# Patient Record
Sex: Male | Born: 1950
Health system: Southern US, Community
[De-identification: ages and names within clinical notes are randomized; demographics above are authoritative.]

## PROBLEM LIST (undated history)

## (undated) DIAGNOSIS — M1990 Unspecified osteoarthritis, unspecified site: Secondary | ICD-10-CM

## (undated) DIAGNOSIS — F32A Depression, unspecified: Secondary | ICD-10-CM

## (undated) DIAGNOSIS — C4491 Basal cell carcinoma of skin, unspecified: Secondary | ICD-10-CM

## (undated) DIAGNOSIS — E119 Type 2 diabetes mellitus without complications: Secondary | ICD-10-CM

## (undated) DIAGNOSIS — F319 Bipolar disorder, unspecified: Secondary | ICD-10-CM

## (undated) DIAGNOSIS — D649 Anemia, unspecified: Secondary | ICD-10-CM

## (undated) DIAGNOSIS — L570 Actinic keratosis: Secondary | ICD-10-CM

## (undated) DIAGNOSIS — I10 Essential (primary) hypertension: Secondary | ICD-10-CM

## (undated) DIAGNOSIS — F329 Major depressive disorder, single episode, unspecified: Secondary | ICD-10-CM

## (undated) DIAGNOSIS — G4730 Sleep apnea, unspecified: Secondary | ICD-10-CM

## (undated) DIAGNOSIS — F419 Anxiety disorder, unspecified: Secondary | ICD-10-CM

## (undated) DIAGNOSIS — N189 Chronic kidney disease, unspecified: Secondary | ICD-10-CM

## (undated) DIAGNOSIS — N289 Disorder of kidney and ureter, unspecified: Secondary | ICD-10-CM

## (undated) DIAGNOSIS — K219 Gastro-esophageal reflux disease without esophagitis: Secondary | ICD-10-CM

## (undated) DIAGNOSIS — M109 Gout, unspecified: Secondary | ICD-10-CM

## (undated) HISTORY — DX: Essential (primary) hypertension: I10

## (undated) HISTORY — PX: INGUINAL HERNIA REPAIR: SUR1180

## (undated) HISTORY — DX: Type 2 diabetes mellitus without complications: E11.9

## (undated) HISTORY — DX: Chronic kidney disease, unspecified: N18.9

## (undated) HISTORY — DX: Bipolar disorder, unspecified: F31.9

## (undated) HISTORY — DX: Disorder of kidney and ureter, unspecified: N28.9

## (undated) HISTORY — PX: OTHER SURGICAL HISTORY: SHX169

## (undated) HISTORY — DX: Basal cell carcinoma of skin, unspecified: C44.91

## (undated) HISTORY — DX: Actinic keratosis: L57.0

## (undated) HISTORY — PX: TRANSURETHRAL RESECTION OF PROSTATE: SHX73

---
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
## 2004-04-13 HISTORY — PX: BACK SURGERY: SHX140

## 2006-04-13 HISTORY — PX: GASTRIC BYPASS: SHX52

## 2015-01-08 DIAGNOSIS — M25511 Pain in right shoulder: Secondary | ICD-10-CM

## 2015-01-08 DIAGNOSIS — G8929 Other chronic pain: Secondary | ICD-10-CM | POA: Insufficient documentation

## 2015-04-14 HISTORY — PX: TOTAL SHOULDER REPLACEMENT: SUR1217

## 2015-04-14 HISTORY — PX: REVISION OF TOTAL SHOULDER: SUR1277

## 2015-05-20 DIAGNOSIS — M19011 Primary osteoarthritis, right shoulder: Secondary | ICD-10-CM | POA: Insufficient documentation

## 2015-06-19 DIAGNOSIS — Z96611 Presence of right artificial shoulder joint: Secondary | ICD-10-CM | POA: Insufficient documentation

## 2016-01-08 DIAGNOSIS — Z96619 Presence of unspecified artificial shoulder joint: Secondary | ICD-10-CM

## 2016-01-08 DIAGNOSIS — T8484XA Pain due to internal orthopedic prosthetic devices, implants and grafts, initial encounter: Secondary | ICD-10-CM | POA: Insufficient documentation

## 2016-02-11 DIAGNOSIS — Z9889 Other specified postprocedural states: Secondary | ICD-10-CM | POA: Insufficient documentation

## 2016-05-01 ENCOUNTER — Encounter (INDEPENDENT_AMBULATORY_CARE_PROVIDER_SITE_OTHER): Payer: Self-pay | Admitting: Neurology

## 2016-05-01 ENCOUNTER — Ambulatory Visit (INDEPENDENT_AMBULATORY_CARE_PROVIDER_SITE_OTHER): Admitting: Neurology

## 2016-05-01 DIAGNOSIS — G5603 Carpal tunnel syndrome, bilateral upper limbs: Secondary | ICD-10-CM

## 2016-05-01 DIAGNOSIS — Z0289 Encounter for other administrative examinations: Secondary | ICD-10-CM

## 2016-05-01 NOTE — Procedures (Signed)
   NCS (NERVE CONDUCTION STUDY) WITH EMG (ELECTROMYOGRAPHY) REPORT   STUDY DATE: @DATE @ PATIENT NAME: Bryan Manning DOB: 19-Dec-1950 MRN: [id number]    TECHNOLOGIST: Laretta Alstrom ELECTROMYOGRAPHER: Marcial Pacas M.D.  CLINICAL INFORMATION:  66 years old right-handed male, presented with a year history of worsening left hand paresthesia, involving first 4 fingers, he denies significant weakness, similar involvement of right hand. He denies neck pain.  On examination: There was no significant bilateral abductor pollicis brevis weakness, decreased pinprick at first the 4 fingerpads. Bilateral wrists Tinel signs.  FINDINGS: NERVE CONDUCTION STUDY: Bilateral ulnar sensory and motor responses were normal. Left median sensory response was absent. Right median sensory response showed mildly prolonged peak latency, 4.9 milliseconds, with well preserved snap amplitude.  Bilateral median motor responses showed significantly prolonged distal latency, left worse than right, left distal latency was 8.9 millisecond, right side was 5.0 millisecond, with well-preserved bilateral C map amplitude, conduction velocity. Left median F wave latency was prolonged at 38 ms, right side was at upper limit of normal 32 ms.      NEEDLE ELECTROMYOGRAPHY: Selected needle examination was performed at left upper extremity muscles, left cervical paraspinal muscles; and right abductor pollicis brevis.   Bilateral abductor pollicis brevis: Normal insertion activity, No spontaneous activity, normal morphology motor unit potential, with mildly decreased recruitment patterns.  Needle examination of left pronator teres, biceps, triceps, deltoid was normal.  There was no spontaneous activity of left cervical paraspinal muscles, left C5, 6, 7.   IMPRESSION:  This is an abnormal study. There is electrodiagnostic evidence of bilateral median neuropathy across the wrist, consistent with bilateral carpal tunnel syndromes. Moderate  at both side, left worse than right, there is no evidence of axonal loss. There is no evidence of left cervical radiculopathy.    INTERPRETING PHYSICIAN:   Marcial Pacas M.D. Ph.D. [facility] [address] [place], [place] [address] [phone]

## 2017-06-29 DIAGNOSIS — G5601 Carpal tunnel syndrome, right upper limb: Secondary | ICD-10-CM | POA: Insufficient documentation

## 2017-09-27 ENCOUNTER — Encounter: Payer: Self-pay | Admitting: Gastroenterology

## 2017-12-22 ENCOUNTER — Encounter: Payer: Self-pay | Admitting: *Deleted

## 2017-12-22 ENCOUNTER — Encounter: Payer: Self-pay | Admitting: Gastroenterology

## 2017-12-22 ENCOUNTER — Other Ambulatory Visit: Payer: Self-pay | Admitting: *Deleted

## 2017-12-22 ENCOUNTER — Ambulatory Visit (INDEPENDENT_AMBULATORY_CARE_PROVIDER_SITE_OTHER): Payer: No Typology Code available for payment source | Admitting: Gastroenterology

## 2017-12-22 VITALS — BP 129/64 | HR 73 | Temp 97.8°F | Ht 75.0 in | Wt 245.2 lb

## 2017-12-22 DIAGNOSIS — Z86010 Personal history of colonic polyps: Secondary | ICD-10-CM | POA: Diagnosis not present

## 2017-12-22 DIAGNOSIS — Z79899 Other long term (current) drug therapy: Secondary | ICD-10-CM

## 2017-12-22 DIAGNOSIS — Z1211 Encounter for screening for malignant neoplasm of colon: Secondary | ICD-10-CM | POA: Insufficient documentation

## 2017-12-22 MED ORDER — PEG 3350-KCL-NA BICARB-NACL 420 G PO SOLR: 4000.0000 mL | Freq: Once | ORAL | 0 refills | Status: AC

## 2017-12-22 NOTE — Progress Notes (Signed)
Primary Care Physician:  [facility]  Primary Gastroenterologist:  Garfield Cornea, MD   Chief Complaint  Patient presents with  . Consult    last TCS was 2014    HPI:  Bryan Manning is a 67 y.o. male here at the request of [facility] for surveillance colonoscopy for history of colon polyps.  Last colonoscopy in 2014, was told to come back in 5 years.  Patient denies any constipation, diarrhea, melena, rectal bleeding.  No abdominal pain.  No unintentional weight loss.  Reflux controlled with Zantac.  No dysphagia.  States his typical GFR runs in the 22-27 range. Will use Trilyte given GFR.    Current Outpatient Medications  Medication Sig Dispense Refill  . B Complex Vitamins (VITAMIN B COMPLEX PO) Take by mouth daily.    Marland Kitchen buPROPion (WELLBUTRIN SR) 150 MG 12 hr tablet Take 150 mg by mouth 2 (two) times daily.    . calcitRIOL (ROCALTROL) 0.5 MCG capsule Take 0.5 mcg by mouth daily.    . Cholecalciferol (VITAMIN D-3) 5000 units TABS Take 10,000 Units by mouth daily.    . COLCHICINE PO Take 1 tablet by mouth as needed.    . desvenlafaxine (PRISTIQ) 100 MG 24 hr tablet Take 100 mg by mouth daily.    . furosemide (LASIX) 40 MG tablet Take 40 mg by mouth 2 (two) times daily.    Marland Kitchen gabapentin (NEURONTIN) 300 MG capsule Take 300 mg by mouth at bedtime.    Marland Kitchen glipiZIDE (GLUCOTROL) 5 MG tablet Take 5 mg by mouth daily before breakfast.    . hydrALAZINE (APRESOLINE) 25 MG tablet Take 25 mg by mouth 2 (two) times daily.    Marland Kitchen labetalol (NORMODYNE) 300 MG tablet Take 300 mg by mouth 2 (two) times daily.    Marland Kitchen loratadine (CLARITIN) 10 MG tablet Take 10 mg by mouth daily.    . QUEtiapine (SEROQUEL) 400 MG tablet Take 400 mg by mouth at bedtime.    . ranitidine (ZANTAC) 150 MG tablet Take 300 mg by mouth 2 (two) times daily.     No current facility-administered medications for this visit.     Allergies as of 12/22/2017 - Review Complete 12/22/2017  Allergen Reaction Noted  .  Iodinated diagnostic agents Hives and Itching 01/08/2015  . Tape Hives 01/08/2015  . Nsaids Other (See Comments) 06/10/2015  . Other  12/02/2016    Past Medical History:  Diagnosis Date  . Bipolar 1 disorder ([place])   . Chronic renal insufficiency    GFR 23  . Diabetes ([place])   . Hypertension     Past Surgical History:  Procedure Laterality Date  . BACK SURGERY  2006  . GASTRIC BYPASS  2008  . INGUINAL HERNIA REPAIR     age 9  . REVISION OF TOTAL SHOULDER  2017  . soft palate reduction    . TOTAL SHOULDER REPLACEMENT  2017  . TRANSURETHRAL RESECTION OF PROSTATE     X 2    Family History  Problem Relation Age of Onset  . Rheum arthritis Mother   . Diabetes Mother   . Heart disease Father   . Colon cancer Neg Hx     Social History   Socioeconomic History  . Marital status: Married    Spouse name: Not on file  . Number of children: Not on file  . Years of education: Not on file  . Highest education level: Not on file  Occupational History  .  Not on file  Social Needs  . Financial resource strain: Not on file  . Food insecurity:    Worry: Not on file    Inability: Not on file  . Transportation needs:    Medical: Not on file    Non-medical: Not on file  Tobacco Use  . Smoking status: Former Smoker    Types: Cigarettes  . Smokeless tobacco: Never Used  Substance and Sexual Activity  . Alcohol use: Yes    Comment: occas  . Drug use: Never  . Sexual activity: Not on file  Lifestyle  . Physical activity:    Days per week: Not on file    Minutes per session: Not on file  . Stress: Not on file  Relationships  . Social connections:    Talks on phone: Not on file    Gets together: Not on file    Attends religious service: Not on file    Active member of club or organization: Not on file    Attends meetings of clubs or organizations: Not on file    Relationship status: Not on file  . Intimate partner violence:    Fear of current or ex partner: Not on file     Emotionally abused: Not on file    Physically abused: Not on file    Forced sexual activity: Not on file  Other Topics Concern  . Not on file  Social History Narrative  . Not on file      ROS:  General: Negative for anorexia, weight loss, fever, chills, fatigue, weakness. Eyes: Negative for vision changes.  ENT: Negative for hoarseness, difficulty swallowing , nasal congestion. CV: Negative for chest pain, angina, palpitations, dyspnea on exertion, peripheral edema.  Respiratory: Negative for dyspnea at rest, dyspnea on exertion, cough, sputum, wheezing.  GI: See history of present illness. GU:  Negative for dysuria, hematuria, urinary incontinence, urinary frequency, nocturnal urination.  MS: Negative for joint pain, low back pain.  Derm: Negative for rash or itching.  Neuro: Negative for weakness, abnormal sensation, seizure, frequent headaches, memory loss, confusion.  Psych: Negative for anxiety, depression, suicidal ideation, hallucinations.  Endo: Negative for unusual weight change.  Heme: Negative for bruising or bleeding. Allergy: Negative for rash or hives.    Physical Examination:  BP 129/64   Pulse 73   Temp 97.8 F (36.6 C) (Oral)   Ht 6\' 3"  (1.905 m)   Wt 245 lb 3.2 oz (111.2 kg)   BMI 30.65 kg/m    General: Well-nourished, well-developed in no acute distress.  Head: Normocephalic, atraumatic.   Eyes: Conjunctiva pink, no icterus. Mouth: Oropharyngeal mucosa moist and pink , no lesions erythema or exudate. Neck: Supple without thyromegaly, masses, or lymphadenopathy.  Lungs: Clear to auscultation bilaterally.  Heart: Regular rate and rhythm, no murmurs rubs or gallops.  Abdomen: Bowel sounds are normal, nontender, nondistended, no hepatosplenomegaly or masses, no abdominal bruits or    hernia , no rebound or guarding.   Rectal: deferred Extremities: No lower extremity edema. No clubbing or deformities.  Neuro: Alert and oriented x 4 , grossly normal  neurologically.  Skin: Warm and dry, no rash or jaundice.   Psych: Alert and cooperative, normal mood and affect.  Labs: Labs dated September 20, 2017 White blood cell count 6700, hemoglobin 15.2, platelets 219,000, glucose 184, BUN 30, creatinine 2.76, potassium 4, sodium 140, TSH 1.020, total bilirubin 0.7, alkaline phosphatase 97, AST 13, ALT 23, albumin 4, GFR 23  Imaging Studies: No results  found.

## 2017-12-22 NOTE — Patient Instructions (Signed)
1. Colonoscopy as scheduled. See separate instructions.  

## 2017-12-23 ENCOUNTER — Telehealth: Payer: Self-pay | Admitting: *Deleted

## 2017-12-23 NOTE — Telephone Encounter (Signed)
Pre-op scheduled for 01/12/18 at 1:15pm. Patient letter. Letter mailed.

## 2017-12-24 ENCOUNTER — Encounter: Payer: Self-pay | Admitting: Gastroenterology

## 2017-12-24 DIAGNOSIS — Z86010 Personal history of colonic polyps: Secondary | ICD-10-CM | POA: Insufficient documentation

## 2017-12-24 NOTE — Assessment & Plan Note (Signed)
67 year old gentleman presenting for 5-year surveillance colonoscopy for history of colon polyps.  Denies any GI symptoms.  Plan for deep sedation given polypharmacy.  I have discussed the risks, alternatives, benefits with regards to but not limited to the risk of reaction to medication, bleeding, infection, perforation and the patient is agreeable to proceed. Written consent to be obtained.

## 2017-12-27 NOTE — Progress Notes (Signed)
CC'D TO PCP °

## 2018-01-11 NOTE — Patient Instructions (Signed)
Bryan Manning  01/11/2018     @PREFPERIOPPHARMACY @   Your procedure is scheduled on 01/17/2018.  Report to [facility] at 9:15 A.M.  Call this number if you have problems the morning of surgery:  [phone]   Remember:  Do not eat or drink after midnight.  FOLLOW INSTRUCTIONS GIVEN TO YOU BY DR ROURK'S OFFICE.  PLEASE CALL HIS OFFICE WITH ANY QUESTIONS             REGARDING PREP      Take these medicines the morning of surgery with A SIP OF WATER Wellbutrin, Pristiq, Gabapentin, Labetalol, Claritin, Seroquel, Zantac    Do not wear jewelry, make-up or nail polish.  Do not wear lotions, powders, or perfumes, or deodorant.  Do not shave 48 hours prior to surgery.  Men may shave face and neck.  Do not bring valuables to the hospital.  [facility] is not responsible for any belongings or valuables.  Contacts, dentures or bridgework may not be worn into surgery.  Leave your suitcase in the car.  After surgery it may be brought to your room.  For patients admitted to the hospital, discharge time will be determined by your treatment team.  Patients discharged the day of surgery will not be allowed to drive home.    Please read over the following fact sheets that you were given. Anesthesia Post-op Instructions      PATIENT INSTRUCTIONS POST-ANESTHESIA  IMMEDIATELY FOLLOWING SURGERY:  Do not drive or operate machinery for the first twenty four hours after surgery.  Do not make any important decisions for twenty four hours after surgery or while taking narcotic pain medications or sedatives.  If you develop intractable nausea and vomiting or a severe headache please notify your doctor immediately.  FOLLOW-UP:  Please make an appointment with your surgeon as instructed. You do not need to follow up with anesthesia unless specifically instructed to do so.  WOUND CARE INSTRUCTIONS (if applicable):  Keep a dry clean dressing on the anesthesia/puncture wound site if there is drainage.   Once the wound has quit draining you may leave it open to air.  Generally you should leave the bandage intact for twenty four hours unless there is drainage.  If the epidural site drains for more than 36-48 hours please call the anesthesia department.  QUESTIONS?:  Please feel free to call your physician or the hospital operator if you have any questions, and they will be happy to assist you.      Colonoscopy, Adult A colonoscopy is an exam to look at the entire large intestine. During the exam, a lubricated, bendable tube is inserted into the anus and then passed into the rectum, colon, and other parts of the large intestine. A colonoscopy is often done as a part of normal colorectal screening or in response to certain symptoms, such as anemia, persistent diarrhea, abdominal pain, and blood in the stool. The exam can help screen for and diagnose medical problems, including:  Tumors.  Polyps.  Inflammation.  Areas of bleeding.  Tell a health care provider about:  Any allergies you have.  All medicines you are taking, including vitamins, herbs, eye drops, creams, and over-the-counter medicines.  Any problems you or family members have had with anesthetic medicines.  Any blood disorders you have.  Any surgeries you have had.  Any medical conditions you have.  Any problems you have had passing stool. What are the risks? Generally, this is a safe procedure. However, problems may  occur, including:  Bleeding.  A tear in the intestine.  A reaction to medicines given during the exam.  Infection (rare).  What happens before the procedure? Eating and drinking restrictions Follow instructions from your health care provider about eating and drinking, which may include:  A few days before the procedure - follow a low-fiber diet. Avoid nuts, seeds, dried fruit, raw fruits, and vegetables.  1-3 days before the procedure - follow a clear liquid diet. Drink only clear liquids, such as  clear broth or bouillon, black coffee or tea, clear juice, clear soft drinks or sports drinks, gelatin dessert, and popsicles. Avoid any liquids that contain red or purple dye.  On the day of the procedure - do not eat or drink anything during the 2 hours before the procedure, or within the time period that your health care provider recommends.  Bowel prep If you were prescribed an oral bowel prep to clean out your colon:  Take it as told by your health care provider. Starting the day before your procedure, you will need to drink a large amount of medicated liquid. The liquid will cause you to have multiple loose stools until your stool is almost clear or light green.  If your skin or anus gets irritated from diarrhea, you may use these to relieve the irritation: ? Medicated wipes, such as adult wet wipes with aloe and vitamin E. ? A skin soothing-product like petroleum jelly.  If you vomit while drinking the bowel prep, take a break for up to 60 minutes and then begin the bowel prep again. If vomiting continues and you cannot take the bowel prep without vomiting, call your health care provider.  General instructions  Ask your health care provider about changing or stopping your regular medicines. This is especially important if you are taking diabetes medicines or blood thinners.  Plan to have someone take you home from the hospital or clinic. What happens during the procedure?  An IV tube may be inserted into one of your veins.  You will be given medicine to help you relax (sedative).  To reduce your risk of infection: ? Your health care team will wash or sanitize their hands. ? Your anal area will be washed with soap.  You will be asked to lie on your side with your knees bent.  Your health care provider will lubricate a long, thin, flexible tube. The tube will have a camera and a light on the end.  The tube will be inserted into your anus.  The tube will be gently eased  through your rectum and colon.  Air will be delivered into your colon to keep it open. You may feel some pressure or cramping.  The camera will be used to take images during the procedure.  A small tissue sample may be removed from your body to be examined under a microscope (biopsy). If any potential problems are found, the tissue will be sent to a lab for testing.  If small polyps are found, your health care provider may remove them and have them checked for cancer cells.  The tube that was inserted into your anus will be slowly removed. The procedure may vary among health care providers and hospitals. What happens after the procedure?  Your blood pressure, heart rate, breathing rate, and blood oxygen level will be monitored until the medicines you were given have worn off.  Do not drive for 24 hours after the exam.  You may have a small amount of  blood in your stool.  You may pass gas and have mild abdominal cramping or bloating due to the air that was used to inflate your colon during the exam.  It is up to you to get the results of your procedure. Ask your health care provider, or the department performing the procedure, when your results will be ready. This information is not intended to replace advice given to you by your health care provider. Make sure you discuss any questions you have with your health care provider. Document Released: 03/27/2000 Document Revised: 01/29/2016 Document Reviewed: 06/11/2015 Elsevier Interactive Patient Education  2018 [employer].

## 2018-01-12 ENCOUNTER — Encounter (HOSPITAL_COMMUNITY): Payer: Self-pay

## 2018-01-12 ENCOUNTER — Encounter (HOSPITAL_COMMUNITY)
Admission: RE | Admit: 2018-01-12 | Discharge: 2018-01-12 | Disposition: A | Discharge status: Home / Self Care | Payer: Medicare Other | Source: Ambulatory Visit | Attending: Internal Medicine | Admitting: Internal Medicine

## 2018-01-12 ENCOUNTER — Other Ambulatory Visit: Payer: Self-pay

## 2018-01-12 DIAGNOSIS — Z1211 Encounter for screening for malignant neoplasm of colon: Secondary | ICD-10-CM | POA: Diagnosis not present

## 2018-01-12 DIAGNOSIS — I447 Left bundle-branch block, unspecified: Secondary | ICD-10-CM | POA: Diagnosis not present

## 2018-01-12 DIAGNOSIS — Z01818 Encounter for other preprocedural examination: Secondary | ICD-10-CM | POA: Diagnosis not present

## 2018-01-12 HISTORY — DX: Sleep apnea, unspecified: G47.30

## 2018-01-12 HISTORY — DX: Anemia, unspecified: D64.9

## 2018-01-12 HISTORY — DX: Gout, unspecified: M10.9

## 2018-01-12 LAB — BASIC METABOLIC PANEL
Anion gap: 10 (ref 5–15)
BUN: 36 mg/dL — ABNORMAL HIGH (ref 8–23)
CO2: 28 mmol/L (ref 22–32)
CREATININE: 2.51 mg/dL — AB (ref 0.61–1.24)
Calcium: 8.6 mg/dL — ABNORMAL LOW (ref 8.9–10.3)
Chloride: 104 mmol/L (ref 98–111)
GFR calc Af Amer: 30 mL/min — ABNORMAL LOW (ref >60)
GFR, EST NON AFRICAN AMERICAN: 25 mL/min — AB (ref >60)
GLUCOSE: 116 mg/dL — AB (ref 70–99)
POTASSIUM: 3.6 mmol/L (ref 3.5–5.1)
Sodium: 142 mmol/L (ref 135–145)

## 2018-01-12 LAB — CBC
HEMATOCRIT: 40.8 % (ref 39.0–52.0)
Hemoglobin: 13.9 g/dL (ref 13.0–17.0)
MCH: 32.7 pg (ref 26.0–34.0)
MCHC: 34.1 g/dL (ref 30.0–36.0)
MCV: 96 fL (ref 78.0–100.0)
Platelets: 253 10*3/uL (ref 150–400)
RBC: 4.25 MIL/uL (ref 4.22–5.81)
RDW: 12.8 % (ref 11.5–15.5)
WBC: 7.4 10*3/uL (ref 4.0–10.5)

## 2018-01-17 ENCOUNTER — Encounter (HOSPITAL_COMMUNITY): Admission: RE | Payer: Self-pay | Source: Ambulatory Visit

## 2018-01-17 ENCOUNTER — Ambulatory Visit (HOSPITAL_COMMUNITY): Admission: RE | Admit: 2018-01-17 | Payer: Non-veteran care | Source: Ambulatory Visit | Admitting: Internal Medicine

## 2018-01-17 SURGERY — COLONOSCOPY WITH PROPOFOL
Anesthesia: Monitor Anesthesia Care

## 2018-01-18 ENCOUNTER — Ambulatory Visit: Payer: Self-pay

## 2018-01-18 ENCOUNTER — Ambulatory Visit (INDEPENDENT_AMBULATORY_CARE_PROVIDER_SITE_OTHER): Payer: Non-veteran care | Admitting: Sports Medicine

## 2018-01-18 ENCOUNTER — Ambulatory Visit (INDEPENDENT_AMBULATORY_CARE_PROVIDER_SITE_OTHER)

## 2018-01-18 ENCOUNTER — Encounter: Payer: Self-pay | Admitting: Sports Medicine

## 2018-01-18 ENCOUNTER — Telehealth: Payer: Self-pay | Admitting: *Deleted

## 2018-01-18 VITALS — BP 131/74 | HR 63

## 2018-01-18 DIAGNOSIS — M19079 Primary osteoarthritis, unspecified ankle and foot: Secondary | ICD-10-CM

## 2018-01-18 DIAGNOSIS — M25572 Pain in left ankle and joints of left foot: Secondary | ICD-10-CM | POA: Diagnosis not present

## 2018-01-18 DIAGNOSIS — M7752 Other enthesopathy of left foot: Secondary | ICD-10-CM | POA: Diagnosis not present

## 2018-01-18 MED ORDER — TRIAMCINOLONE ACETONIDE 10 MG/ML IJ SUSP: 10.0000 mg | Freq: Once | INTRAMUSCULAR | Status: DC

## 2018-01-18 NOTE — Telephone Encounter (Signed)
-----   Message from Landis Martins, [place] sent at 01/18/2018 11:09 AM EDT ----- Regarding: MRI Report from [facility] Please have them send [place] latest MRI L foot and ankle Thanks Dr. Cannon Kettle

## 2018-01-18 NOTE — Progress Notes (Signed)
Subjective:  Bryan Manning. is a 67 y.o. male patient who presents to office for evaluation of left foot and ankle pain. Patient complains of continued pain in the left foot and ankle since he twisted his foot and injured it in 1992 while in the [employer].  Patient reports that since then he has had constant pain that radiates down from the ankle to the first toe with use especially worse when walking states that the pain is very sharp shooting in nature and sometimes even at rest a dull achy pain states in the past in addition to a sprain injury of which he was always told he had he was placed in a cast back in 1995 states that now the pain has become constant and that he has had all of his treatment within the [place] and has been recently referred out because they do not do any type of arthroscopic procedures of the ankle at the [place] and recommended him to see a civilian doctor. 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  Patient denies any other pedal complaints at this time.  Review of Systems  Musculoskeletal: Positive for joint pain and myalgias.  All other systems reviewed and are negative.    Patient Active Problem List   Diagnosis Date Noted  . H/O adenomatous polyp of colon 12/24/2017  . Encounter for screening colonoscopy 12/22/2017  . Polypharmacy 12/22/2017  . Carpal tunnel syndrome on right 06/29/2017  . S/P shoulder surgery 02/11/2016  . Pain due to shoulder joint prosthesis ([place]) 01/08/2016  . S/P reverse total shoulder arthroplasty, right 06/19/2015  . Arthritis of right shoulder region 05/20/2015  . Chronic right shoulder pain 01/08/2015    Current Outpatient Medications on File Prior to Visit  Medication Sig Dispense Refill  . B Complex Vitamins (VITAMIN B  COMPLEX PO) Take 1 tablet by mouth daily.     Marland Kitchen buPROPion (WELLBUTRIN SR) 150 MG 12 hr tablet Take 150 mg by mouth 2 (two) times daily.    . calcitRIOL (ROCALTROL) 0.5 MCG capsule Take 0.5 mcg by mouth daily.    Marland Kitchen desvenlafaxine (PRISTIQ) 100 MG 24 hr tablet Take 100 mg by mouth at bedtime.     . furosemide (LASIX) 40 MG tablet Take 40 mg by mouth 2 (two) times daily.    Marland Kitchen gabapentin (NEURONTIN) 300 MG capsule Take 300 mg by mouth at bedtime.    Marland Kitchen glipiZIDE (GLUCOTROL) 5 MG tablet Take 5 mg by mouth daily before breakfast.    . hydrALAZINE (APRESOLINE) 25 MG tablet Take 25 mg by mouth 2 (two) times daily.    Marland Kitchen labetalol (NORMODYNE) 300 MG tablet Take 300 mg by mouth 2 (two) times daily.    Marland Kitchen loratadine (CLARITIN) 10 MG tablet Take 10 mg by mouth daily.    . polyvinyl alcohol (ARTIFICIAL TEARS) 1.4 % ophthalmic solution Place 1 drop into both eyes daily as needed for dry eyes.    Marland Kitchen QUEtiapine (SEROQUEL) 400 MG tablet Take 400 mg by mouth at bedtime.    . ranitidine (ZANTAC) 150 MG tablet Take 300 mg by mouth 2 (two) times daily.     No current facility-administered medications on file prior to visit.     Allergies  Allergen Reactions  .  Iodinated Diagnostic Agents Hives and Itching  . Tape Hives  . Nsaids Other (See Comments)    Hx gastric bypass can not take nsaids   . Other Other (See Comments)    TERAMYCIN-trouble breathing    Objective:  General: Alert and oriented x3 in no acute distress  Dermatology: No open lesions bilateral lower extremities, no webspace macerations, no ecchymosis bilateral, all nails x 10 are short and thick but well manicured.  Vascular: Dorsalis Pedis and Posterior Tibial pedal pulses palpable, Capillary Fill Time 3 seconds,(+) pedal hair growth bilateral, no edema bilateral lower extremities,+ mild varicosities bilateral, temperature gradient within normal limits.  Neurology: Johney Maine sensation intact via light touch bilateral.   Musculoskeletal: Mild  tenderness with palpation at posterior tibial tendon course especially at the medial ankle on the left that radiates in the front and side of the ankle. Negative talar tilt, Negative tib-fib stress, No instability. No pain with calf compression bilateral. Range of motion within normal limits with mild guarding on left ankle. Strength within normal limits in all groups bilateral.   Gait: Antalgic gait  Xrays  Left foot and ankle   Impression: Normal osseous mineralization, there is mild ankle joint space narrowing suggestive of early arthritis, mild soft tissue swelling at the ankle otherwise there are no other acute findings.  Assessment and Plan: Problem List Items Addressed This Visit    None    Visit Diagnoses    Tendinitis of left ankle    -  Primary   Relevant Medications   triamcinolone acetonide (KENALOG) 10 MG/ML injection 10 mg (Start on 01/18/2018  8:45 PM)   Other Relevant Orders   DG Ankle Complete Left (Completed)   Pain in left ankle and joints of left foot       Arthritis of ankle          -Complete examination performed -Xrays reviewed -Discussed treatement options for likely tendinitis secondary to arthritis -After oral consent and aseptic prep, injected a mixture containing 1 ml of 2%  plain lidocaine, 1 ml 0.5% plain marcaine, 0.5 ml of kenalog 10 and 0.5 ml of dexamethasone phosphate into posterior tibial tendon course on left without complication. Post-injection care discussed with patient.  -Advised patient to use ankle support that he has at home and have my nurse to instruct him on how to properly wear the Tri-Lock brace of which he states he already has at home -Patient did ask me for additional pain medication however the only medication I advised patient that I could offer him at this time his tramadol and that I could not give him anything more for any ongoing chronic pain -Office to request MRI records from the [place] at this time there is nothing available for  me to review -Patient to return to office in 3 to 4 weeks for follow-up evaluation or sooner if condition worsens.  Landis Martins, DPM

## 2018-01-31 ENCOUNTER — Telehealth: Payer: Self-pay | Admitting: *Deleted

## 2018-01-31 NOTE — Telephone Encounter (Signed)
Received call from [place]. Patient did not have TCS done scheduled for 01/17/18. Called patient and LMOVM.

## 2018-02-01 NOTE — Telephone Encounter (Signed)
Noted  

## 2018-02-01 NOTE — Telephone Encounter (Signed)
[facility] MAILED. FYI to [facility]

## 2018-08-17 ENCOUNTER — Other Ambulatory Visit (HOSPITAL_COMMUNITY): Payer: Self-pay | Admitting: Internal Medicine

## 2018-08-17 ENCOUNTER — Other Ambulatory Visit: Payer: Self-pay | Admitting: Internal Medicine

## 2018-08-17 DIAGNOSIS — R634 Abnormal weight loss: Secondary | ICD-10-CM

## 2018-08-17 DIAGNOSIS — R110 Nausea: Secondary | ICD-10-CM

## 2018-08-17 DIAGNOSIS — R10816 Epigastric abdominal tenderness: Secondary | ICD-10-CM

## 2018-08-25 ENCOUNTER — Other Ambulatory Visit: Payer: Self-pay

## 2018-08-25 ENCOUNTER — Ambulatory Visit
Admission: RE | Admit: 2018-08-25 | Discharge: 2018-08-25 | Disposition: A | Discharge status: Home / Self Care | Payer: Medicare Other | Source: Ambulatory Visit | Attending: Internal Medicine | Admitting: Internal Medicine

## 2018-08-25 DIAGNOSIS — R634 Abnormal weight loss: Secondary | ICD-10-CM | POA: Diagnosis not present

## 2018-08-25 DIAGNOSIS — R10816 Epigastric abdominal tenderness: Secondary | ICD-10-CM | POA: Diagnosis present

## 2018-08-25 DIAGNOSIS — R110 Nausea: Secondary | ICD-10-CM | POA: Insufficient documentation

## 2018-09-01 ENCOUNTER — Other Ambulatory Visit: Admission: RE | Admit: 2018-09-01 | Payer: No Typology Code available for payment source | Source: Ambulatory Visit

## 2018-09-02 ENCOUNTER — Other Ambulatory Visit: Payer: Self-pay

## 2018-09-02 ENCOUNTER — Other Ambulatory Visit
Admission: RE | Admit: 2018-09-02 | Discharge: 2018-09-02 | Disposition: A | Discharge status: Home / Self Care | Payer: Medicare Other | Source: Ambulatory Visit | Attending: Internal Medicine | Admitting: Internal Medicine

## 2018-09-02 ENCOUNTER — Other Ambulatory Visit: Payer: No Typology Code available for payment source

## 2018-09-02 DIAGNOSIS — Z1159 Encounter for screening for other viral diseases: Secondary | ICD-10-CM | POA: Insufficient documentation

## 2018-09-03 LAB — NOVEL CORONAVIRUS, NAA (HOSP ORDER, SEND-OUT TO REF LAB; TAT 18-24 HRS): SARS-CoV-2, NAA: NOT DETECTED

## 2018-09-06 ENCOUNTER — Encounter: Payer: Self-pay | Admitting: *Deleted

## 2018-09-07 ENCOUNTER — Ambulatory Visit: Payer: Medicare Other | Admitting: Certified Registered"

## 2018-09-07 ENCOUNTER — Encounter: Payer: Self-pay | Admitting: *Deleted

## 2018-09-07 ENCOUNTER — Ambulatory Visit
Admission: RE | Admit: 2018-09-07 | Discharge: 2018-09-07 | Disposition: A | Discharge status: Home / Self Care | Payer: Medicare Other | Attending: Internal Medicine | Admitting: Internal Medicine

## 2018-09-07 ENCOUNTER — Other Ambulatory Visit: Payer: Self-pay

## 2018-09-07 ENCOUNTER — Encounter
Admission: RE | Disposition: A | Discharge status: Home / Self Care | Payer: Self-pay | Source: Home / Self Care | Attending: Internal Medicine

## 2018-09-07 DIAGNOSIS — Z79899 Other long term (current) drug therapy: Secondary | ICD-10-CM | POA: Diagnosis not present

## 2018-09-07 DIAGNOSIS — M1990 Unspecified osteoarthritis, unspecified site: Secondary | ICD-10-CM | POA: Diagnosis not present

## 2018-09-07 DIAGNOSIS — R634 Abnormal weight loss: Secondary | ICD-10-CM | POA: Insufficient documentation

## 2018-09-07 DIAGNOSIS — G4730 Sleep apnea, unspecified: Secondary | ICD-10-CM | POA: Diagnosis not present

## 2018-09-07 DIAGNOSIS — N189 Chronic kidney disease, unspecified: Secondary | ICD-10-CM | POA: Diagnosis not present

## 2018-09-07 DIAGNOSIS — D120 Benign neoplasm of cecum: Secondary | ICD-10-CM | POA: Insufficient documentation

## 2018-09-07 DIAGNOSIS — Z7984 Long term (current) use of oral hypoglycemic drugs: Secondary | ICD-10-CM | POA: Insufficient documentation

## 2018-09-07 DIAGNOSIS — E1122 Type 2 diabetes mellitus with diabetic chronic kidney disease: Secondary | ICD-10-CM | POA: Diagnosis not present

## 2018-09-07 DIAGNOSIS — Z9884 Bariatric surgery status: Secondary | ICD-10-CM | POA: Diagnosis not present

## 2018-09-07 DIAGNOSIS — K640 First degree hemorrhoids: Secondary | ICD-10-CM | POA: Insufficient documentation

## 2018-09-07 DIAGNOSIS — D122 Benign neoplasm of ascending colon: Secondary | ICD-10-CM | POA: Diagnosis not present

## 2018-09-07 DIAGNOSIS — R194 Change in bowel habit: Secondary | ICD-10-CM | POA: Diagnosis not present

## 2018-09-07 DIAGNOSIS — Z86010 Personal history of colonic polyps: Secondary | ICD-10-CM | POA: Diagnosis not present

## 2018-09-07 DIAGNOSIS — K219 Gastro-esophageal reflux disease without esophagitis: Secondary | ICD-10-CM | POA: Insufficient documentation

## 2018-09-07 DIAGNOSIS — K2970 Gastritis, unspecified, without bleeding: Secondary | ICD-10-CM | POA: Insufficient documentation

## 2018-09-07 DIAGNOSIS — M109 Gout, unspecified: Secondary | ICD-10-CM | POA: Diagnosis not present

## 2018-09-07 DIAGNOSIS — I129 Hypertensive chronic kidney disease with stage 1 through stage 4 chronic kidney disease, or unspecified chronic kidney disease: Secondary | ICD-10-CM | POA: Diagnosis not present

## 2018-09-07 DIAGNOSIS — F419 Anxiety disorder, unspecified: Secondary | ICD-10-CM | POA: Insufficient documentation

## 2018-09-07 DIAGNOSIS — F319 Bipolar disorder, unspecified: Secondary | ICD-10-CM | POA: Diagnosis not present

## 2018-09-07 DIAGNOSIS — R1013 Epigastric pain: Secondary | ICD-10-CM | POA: Insufficient documentation

## 2018-09-07 DIAGNOSIS — K319 Disease of stomach and duodenum, unspecified: Secondary | ICD-10-CM | POA: Insufficient documentation

## 2018-09-07 HISTORY — DX: Unspecified osteoarthritis, unspecified site: M19.90

## 2018-09-07 HISTORY — DX: Anxiety disorder, unspecified: F41.9

## 2018-09-07 HISTORY — DX: Gastro-esophageal reflux disease without esophagitis: K21.9

## 2018-09-07 HISTORY — DX: Depression, unspecified: F32.A

## 2018-09-07 HISTORY — PX: COLONOSCOPY WITH PROPOFOL: SHX5780

## 2018-09-07 HISTORY — PX: ESOPHAGOGASTRODUODENOSCOPY (EGD) WITH PROPOFOL: SHX5813

## 2018-09-07 LAB — GLUCOSE, CAPILLARY: Glucose-Capillary: 147 mg/dL — ABNORMAL HIGH (ref 70–99)

## 2018-09-07 SURGERY — ESOPHAGOGASTRODUODENOSCOPY (EGD) WITH PROPOFOL
Anesthesia: General

## 2018-09-07 MED ORDER — SODIUM CHLORIDE 0.9 % IV SOLN
INTRAVENOUS | Status: DC
  Administered 2018-09-07: 09:00:00 via INTRAVENOUS

## 2018-09-07 MED ORDER — PROPOFOL 500 MG/50ML IV EMUL
INTRAVENOUS | Status: DC | PRN
  Administered 2018-09-07: 120 ug/kg/min via INTRAVENOUS

## 2018-09-07 MED ORDER — LIDOCAINE 2% (20 MG/ML) 5 ML SYRINGE
INTRAMUSCULAR | Status: DC | PRN
  Administered 2018-09-07: 25 mg via INTRAVENOUS

## 2018-09-07 MED ORDER — GLYCOPYRROLATE 0.2 MG/ML IJ SOLN
INTRAMUSCULAR | Status: DC | PRN
  Administered 2018-09-07: 0.2 mg via INTRAVENOUS

## 2018-09-07 MED ORDER — MIDAZOLAM HCL 5 MG/5ML IJ SOLN
INTRAMUSCULAR | Status: DC | PRN
  Administered 2018-09-07: 2 mg via INTRAVENOUS

## 2018-09-07 MED ORDER — FENTANYL CITRATE (PF) 100 MCG/2ML IJ SOLN
INTRAMUSCULAR | Status: AC
  Filled 2018-09-07: qty 2

## 2018-09-07 MED ORDER — MIDAZOLAM HCL 2 MG/2ML IJ SOLN
INTRAMUSCULAR | Status: AC
  Filled 2018-09-07: qty 2

## 2018-09-07 MED ORDER — FENTANYL CITRATE (PF) 100 MCG/2ML IJ SOLN
INTRAMUSCULAR | Status: DC | PRN
  Administered 2018-09-07: 100 ug via INTRAVENOUS

## 2018-09-07 MED ORDER — PROPOFOL 10 MG/ML IV BOLUS
INTRAVENOUS | Status: DC | PRN
  Administered 2018-09-07: 30 mg via INTRAVENOUS
  Administered 2018-09-07: 70 mg via INTRAVENOUS

## 2018-09-07 NOTE — H&P (Signed)
Outpatient short stay form Pre-procedure 09/07/2018 8:59 AM  K. Alice Reichert, M.D.  Primary Physician: [facility]   Reason for visit:  Epigastric pain, Change in bowel habits, Personal hx of colon polyps.    History of present illness: Patient with epigastric pain, weight loss, no dysphagia. Has chronic constipation which recently has improved with taking daily Miralax. Has personal hx of polyps.     Current Facility-Administered Medications:  .  0.9 %  sodium chloride infusion, , Intravenous, Continuous, , Benay Pike, MD  Medications Prior to Admission  Medication Sig Dispense Refill Last Dose  . ARIPiprazole (ABILIFY) 2 MG tablet Take 2 mg by mouth daily.   09/06/2018 at Unknown time  . calcitRIOL (ROCALTROL) 0.5 MCG capsule Take 0.5 mcg by mouth daily.   Past Week at Unknown time  . famotidine (PEPCID) 20 MG tablet Take 20 mg by mouth 2 (two) times daily.   09/07/2018 at Unknown time  . furosemide (LASIX) 40 MG tablet Take 40 mg by mouth 2 (two) times daily.   09/06/2018 at Unknown time  . gabapentin (NEURONTIN) 300 MG capsule Take 300 mg by mouth at bedtime.   09/06/2018 at Unknown time  . glipiZIDE (GLUCOTROL) 5 MG tablet Take 5 mg by mouth daily before breakfast.   09/06/2018 at Unknown time  . labetalol (NORMODYNE) 300 MG tablet Take 50 mg by mouth 2 (two) times daily.    09/07/2018 at Unknown time  . Lidocaine 5 % CREA Apply 300 g topically 3 (three) times daily as needed.     . loratadine (CLARITIN) 10 MG tablet Take 10 mg by mouth daily.   09/06/2018 at Unknown time  . PARoxetine (PAXIL) 20 MG tablet Take 20 mg by mouth daily.   09/06/2018 at Unknown time  . tamsulosin (FLOMAX) 0.4 MG CAPS capsule Take 0.4 mg by mouth daily.   09/06/2018 at Unknown time  . atorvastatin (LIPITOR) 20 MG tablet Take 20 mg by mouth daily at 6 PM.   Not Taking at Unknown time  . B Complex Vitamins (VITAMIN B COMPLEX PO) Take 1 tablet by mouth daily.    Taking  . buPROPion (WELLBUTRIN SR) 150 MG 12 hr  tablet Take 150 mg by mouth 2 (two) times daily.   Not Taking at Unknown time  . clonazePAM (KLONOPIN) 0.5 MG tablet Take 0.5 mg by mouth 2 (two) times daily as needed for anxiety.   Not Taking at Unknown time  . desvenlafaxine (PRISTIQ) 100 MG 24 hr tablet Take 100 mg by mouth at bedtime.    Not Taking at Unknown time  . ferrous sulfate 325 (65 FE) MG EC tablet Take 325 mg by mouth 2 (two) times daily with a meal.   Not Taking at Unknown time  . hydrALAZINE (APRESOLINE) 25 MG tablet Take 25 mg by mouth 2 (two) times daily.   Taking  . polyvinyl alcohol (ARTIFICIAL TEARS) 1.4 % ophthalmic solution Place 1 drop into both eyes daily as needed for dry eyes.     Marland Kitchen QUEtiapine (SEROQUEL) 400 MG tablet Take 400 mg by mouth at bedtime.   Not Taking at Unknown time  . ranitidine (ZANTAC) 150 MG tablet Take 300 mg by mouth 2 (two) times daily.   Not Taking at Unknown time  . venlafaxine (EFFEXOR) 37.5 MG tablet Take 37.5 mg by mouth at bedtime.   Not Taking at Unknown time     Allergies  Allergen Reactions  . Iodinated Diagnostic Agents Hives and Itching  .  Other Other (See Comments)    TERAMYCIN-trouble breathing  . Tape Hives and Rash  . Nsaids Other (See Comments)    Hx gastric bypass can not take nsaids      Past Medical History:  Diagnosis Date  . Anemia   . Anxiety   . Arthritis   . Bipolar 1 disorder ([place])   . Chronic renal insufficiency    GFR 23  . Depression   . Diabetes ([place])   . GERD (gastroesophageal reflux disease)   . Gout   . Hypertension   . Sleep apnea    cannot tolerate CPAP    Review of systems:  Otherwise negative.    Physical Exam  Gen: Alert, oriented. Appears stated age.  HEENT: [place]/AT. PERRLA. Lungs: CTA, no wheezes. CV: RR nl S1, S2. Abd: soft, benign, no masses. BS+ Ext: No edema. Pulses 2+    Planned procedures: Proceed with EGD and colonoscopy. The patient understands the nature of the planned procedure, indications, risks, alternatives and  potential complications including but not limited to bleeding, infection, perforation, damage to internal organs and possible oversedation/side effects from anesthesia. The patient agrees and gives consent to proceed.  Please refer to procedure notes for findings, recommendations and patient disposition/instructions.      K. Alice Reichert, M.D. Gastroenterology 09/07/2018  8:59 AM

## 2018-09-07 NOTE — Anesthesia Postprocedure Evaluation (Signed)
Anesthesia Post Note  Patient: Bryan Manning.  Procedure(s) Performed: ESOPHAGOGASTRODUODENOSCOPY (EGD) WITH PROPOFOL (N/A ) COLONOSCOPY WITH PROPOFOL (N/A )  Patient location during evaluation: Endoscopy Anesthesia Type: General Level of consciousness: awake and alert and oriented Pain management: pain level controlled Vital Signs Assessment: post-procedure vital signs reviewed and stable Respiratory status: spontaneous breathing, nonlabored ventilation and respiratory function stable Cardiovascular status: blood pressure returned to baseline and stable Postop Assessment: no signs of nausea or vomiting Anesthetic complications: no     Last Vitals:  Vitals:   09/07/18 1035 09/07/18 1052  BP: 139/77 (!) 141/69  Pulse: 95 88  Resp: 15 17  Temp: (!) 36.3 C   SpO2: 98% 100%    Last Pain:  Vitals:   09/07/18 1052  TempSrc:   PainSc: 0-No pain                 [name]

## 2018-09-07 NOTE — Anesthesia Preprocedure Evaluation (Signed)
Anesthesia Evaluation  Patient identified by MRN, date of birth, ID band Patient awake    Reviewed: Allergy & Precautions, NPO status , Patient's Chart, lab work & pertinent test results  History of Anesthesia Complications Negative for: history of anesthetic complications  Airway Mallampati: II  TM Distance: >3 FB Neck ROM: Full    Dental no notable dental hx.    Pulmonary sleep apnea (doesn't use CPAP) , neg COPD, former smoker,    breath sounds clear to auscultation- rhonchi (-) wheezing      Cardiovascular hypertension, Pt. on medications (-) CAD, (-) Past MI, (-) Cardiac Stents and (-) CABG  Rhythm:Regular Rate:Normal - Systolic murmurs and - Diastolic murmurs    Neuro/Psych neg Seizures PSYCHIATRIC DISORDERS Anxiety Depression Bipolar Disorder negative neurological ROS     GI/Hepatic Neg liver ROS, GERD  ,  Endo/Other  diabetes, Oral Hypoglycemic Agents  Renal/GU Renal InsufficiencyRenal disease     Musculoskeletal  (+) Arthritis ,   Abdominal (+) - obese,   Peds  Hematology  (+) anemia ,   Anesthesia Other Findings Past Medical History: No date: Anemia No date: Anxiety No date: Arthritis No date: Bipolar 1 disorder (HCC) No date: Chronic renal insufficiency     Comment:  GFR 23 No date: Depression No date: Diabetes (HCC) No date: GERD (gastroesophageal reflux disease) No date: Gout No date: Hypertension No date: Sleep apnea     Comment:  cannot tolerate CPAP   Reproductive/Obstetrics                             Anesthesia Physical Anesthesia Plan  ASA: III  Anesthesia Plan: General   Post-op Pain Management:    Induction: Intravenous  PONV Risk Score and Plan: 1 and Propofol infusion  Airway Management Planned: Natural Airway  Additional Equipment:   Intra-op Plan:   Post-operative Plan:   Informed Consent: I have reviewed the patients History and  Physical, chart, labs and discussed the procedure including the risks, benefits and alternatives for the proposed anesthesia with the patient or authorized representative who has indicated his/her understanding and acceptance.     Dental advisory given  Plan Discussed with: CRNA and Anesthesiologist  Anesthesia Plan Comments:         Anesthesia Quick Evaluation

## 2018-09-07 NOTE — Interval H&P Note (Signed)
History and Physical Interval Note:  09/07/2018 9:03 AM  Bryan Manning.  has presented today for surgery, with the diagnosis of IDA, Nausea, Changes in Bowel Habits, Constipation.  The various methods of treatment have been discussed with the patient and family. After consideration of risks, benefits and other options for treatment, the patient has consented to  Procedure(s): ESOPHAGOGASTRODUODENOSCOPY (EGD) WITH PROPOFOL (N/A) COLONOSCOPY WITH PROPOFOL (N/A) as a surgical intervention.  The patient's history has been reviewed, patient examined, no change in status, stable for surgery.  I have reviewed the patient's chart and labs.  Questions were answered to the patient's satisfaction.     [place], [place]

## 2018-09-07 NOTE — Transfer of Care (Signed)
Immediate Anesthesia Transfer of Care Note  Patient: Bryan Manning.  Procedure(s) Performed: ESOPHAGOGASTRODUODENOSCOPY (EGD) WITH PROPOFOL (N/A ) COLONOSCOPY WITH PROPOFOL (N/A )  Patient Location: Endoscopy Unit  Anesthesia Type:General  Level of Consciousness: awake  Airway & Oxygen Therapy: Patient Spontanous Breathing and Patient connected to nasal cannula oxygen  Post-op Assessment: Report given to RN and Post -op Vital signs reviewed and stable  Post vital signs: Reviewed  Last Vitals:  Vitals Value Taken Time  BP 156/83 09/07/2018 10:26 AM  Temp 36.1 C 09/07/2018 10:26 AM  Pulse 81 09/07/2018 10:26 AM  Resp 15 09/07/2018 10:26 AM  SpO2 100 % 09/07/2018 10:26 AM  Vitals shown include unvalidated device data.  Last Pain:  Vitals:   09/07/18 1025  TempSrc: Tympanic  PainSc: Asleep         Complications: No apparent anesthesia complications

## 2018-09-07 NOTE — Anesthesia Post-op Follow-up Note (Signed)
Anesthesia QCDR form completed.        

## 2018-09-07 NOTE — Op Note (Signed)
[facility] Gastroenterology Patient Name: Bryan Manning Procedure Date: 09/07/2018 9:31 AM MRN: [id number] Account #: [id number] Date of Birth: 21-Oct-1950 Admit Type: Outpatient Age: 68 Room: [facility] ENDO ROOM 4 Gender: Male Note Status: Finalized Procedure:            Colonoscopy Indications:          High risk colon cancer surveillance: Personal history                        of colonic polyps Providers:            Benay Pike. [place] MD, MD Medicines:            Propofol per Anesthesia Complications:        No immediate complications. Procedure:            Pre-Anesthesia Assessment:                       - The risks and benefits of the procedure and the                        sedation options and risks were discussed with the                        patient. All questions were answered and informed                        consent was obtained.                       - Patient identification and proposed procedure were                        verified prior to the procedure by the nurse. The                        procedure was verified in the procedure room.                       - ASA Grade Assessment: III - A patient with severe                        systemic disease.                       - After reviewing the risks and benefits, the patient                        was deemed in satisfactory condition to undergo the                        procedure.                       - The risks and benefits of the procedure and the                        sedation options and risks were discussed with the                        patient. All questions were answered and informed  consent was obtained.                       - Patient identification and proposed procedure were                        verified prior to the procedure by the nurse. The                        procedure was verified in the pre-procedure area.                       - ASA Grade  Assessment: III - A patient with severe                        systemic disease.                       - After reviewing the risks and benefits, the patient                        was deemed in satisfactory condition to undergo the                        procedure.                       After obtaining informed consent, the colonoscope was                        passed under direct vision. Throughout the procedure,                        the patient's blood pressure, pulse, and oxygen                        saturations were monitored continuously. The                        Colonoscope was introduced through the anus and                        advanced to the the cecum, identified by appendiceal                        orifice and ileocecal valve. The colonoscopy was                        technically difficult and complex due to a redundant                        colon and significant looping. Successful completion of                        the procedure was aided by applying abdominal pressure.                        The patient tolerated the procedure well. The quality                        of the bowel preparation was good except the  descending                        colon was fair. Findings:      The perianal and digital rectal examinations were normal. Pertinent       negatives include normal sphincter tone and no palpable rectal lesions.      Two sessile polyps were found in the ascending colon and ileocecal       valve. The polyps were 6 to 9 mm in size. These polyps were removed with       a hot snare. Resection and retrieval were complete.      A 4 mm polyp was found in the ascending colon. The polyp was sessile.       The polyp was removed with a jumbo cold forceps. Resection and retrieval       were complete.      Non-bleeding internal hemorrhoids were found during retroflexion. The       hemorrhoids were Grade I (internal hemorrhoids that do not prolapse).      The exam was  otherwise without abnormality on direct and retroflexion       views. Impression:           - Two 6 to 9 mm polyps in the ascending colon and at                        the ileocecal valve, removed with a hot snare. Resected                        and retrieved.                       - One 4 mm polyp in the ascending colon, removed with a                        jumbo cold forceps. Resected and retrieved.                       - Non-bleeding internal hemorrhoids.                       - The examination was otherwise normal on direct and                        retroflexion views. Recommendation:       - Patient has a contact number available for                        emergencies. The signs and symptoms of potential                        delayed complications were discussed with the patient.                        Return to normal activities tomorrow. Written discharge                        instructions were provided to the patient.                       - Resume previous diet.                       -  Continue present medications.                       - Await pathology results from EGD, also performed                        today.                       - Repeat colonoscopy in 3 years for adenoma                        surveillance.                       - Return to GI office PRN.                       - The findings and recommendations were discussed with                        the patient. Procedure Code(s):    --- Professional ---                       [phone], Colonoscopy, flexible; with removal of tumor(s),                        polyp(s), or other lesion(s) by snare technique                       [id number], 37, Colonoscopy, flexible; with biopsy, single                        or multiple Diagnosis Code(s):    --- Professional ---                       K64.0, First degree hemorrhoids                       Z86.010, Personal history of colonic polyps                       K63.5, Polyp of  colon CPT copyright 2019 American Medical Association. All rights reserved. The codes documented in this report are preliminary and upon coder review may  be revised to meet current compliance requirements. Efrain Sella MD, MD 09/07/2018 10:30:51 AM This report has been signed electronically. Number of Addenda: 0 Note Initiated On: 09/07/2018 9:31 AM Scope Withdrawal Time: 0 hours 24 minutes 10 seconds  Total Procedure Duration: 0 hours 34 minutes 9 seconds       [facility]

## 2018-09-07 NOTE — Op Note (Signed)
[facility] Gastroenterology Patient Name: Bryan Manning Procedure Date: 09/07/2018 9:30 AM MRN: [id number] Account #: [id number] Date of Birth: [birth year] Admit Type: Outpatient Age: 68 Room: [facility] ENDO ROOM 4 Gender: Male Note Status: Finalized Procedure:            Upper GI endoscopy Indications:          Epigastric abdominal pain, Weight loss Providers:            Benay Pike. Robi Dewolfe MD, MD Medicines:            Propofol per Anesthesia Complications:        No immediate complications. Procedure:            Pre-Anesthesia Assessment:                       - The risks and benefits of the procedure and the                        sedation options and risks were discussed with the                        patient. All questions were answered and informed                        consent was obtained.                       - Patient identification and proposed procedure were                        verified prior to the procedure by the nurse. The                        procedure was verified in the procedure room.                       - ASA Grade Assessment: III - A patient with severe                        systemic disease.                       - After reviewing the risks and benefits, the patient                        was deemed in satisfactory condition to undergo the                        procedure.                       After obtaining informed consent, the endoscope was                        passed under direct vision. Throughout the procedure,                        the patient's blood pressure, pulse, and oxygen                        saturations were monitored continuously. The Endoscope  was introduced through the mouth, and advanced to the                        efferent jejunal loop. The upper GI endoscopy was                        accomplished without difficulty. The patient tolerated                        the procedure  well. Findings:      The examined esophagus was normal.      Evidence of a Roux-en-Y anastomosis was found in the stomach. This was       characterized by healthy appearing mucosa.      Localized minimal inflammation characterized by erythema was found in       the cardia. Biopsies were taken with a cold forceps for Helicobacter       pylori testing.      The examined jejunum was normal. Impression:           - Normal esophagus.                       - A Roux-en-Y anastomosis was found, characterized by                        healthy appearing mucosa.                       - Gastritis. Biopsied.                       - Normal examined jejunum. Recommendation:       - Await pathology results.                       - Proceed with colonoscopy Procedure Code(s):    --- Professional ---                       [phone], Esophagogastroduodenoscopy, flexible, transoral;                        with biopsy, single or multiple Diagnosis Code(s):    --- Professional ---                       R63.4, Abnormal weight loss                       R10.13, Epigastric pain                       K29.70, Gastritis, unspecified, without bleeding                       Z98.84, Bariatric surgery status CPT copyright 2019 American Medical Association. All rights reserved. The codes documented in this report are preliminary and upon coder review may  be revised to meet current compliance requirements. Efrain Sella MD, MD 09/07/2018 9:44:43 AM This report has been signed electronically. Number of Addenda: 0 Note Initiated On: 09/07/2018 9:30 AM      [facility]

## 2018-09-08 LAB — SURGICAL PATHOLOGY

## 2018-11-08 ENCOUNTER — Other Ambulatory Visit: Payer: Self-pay | Admitting: Orthopedic Surgery

## 2018-11-08 DIAGNOSIS — M898X6 Other specified disorders of bone, lower leg: Secondary | ICD-10-CM

## 2018-11-15 ENCOUNTER — Encounter
Admission: RE | Admit: 2018-11-15 | Discharge: 2018-11-15 | Disposition: A | Discharge status: Home / Self Care | Payer: Medicare Other | Source: Ambulatory Visit | Attending: Orthopedic Surgery | Admitting: Orthopedic Surgery

## 2018-11-15 ENCOUNTER — Other Ambulatory Visit: Payer: Self-pay

## 2018-11-15 DIAGNOSIS — M898X6 Other specified disorders of bone, lower leg: Secondary | ICD-10-CM | POA: Insufficient documentation

## 2018-11-15 MED ORDER — TECHNETIUM TC 99M MEDRONATE IV KIT
20.0000 | PACK | Freq: Once | INTRAVENOUS | Status: AC | PRN
  Administered 2018-11-15: 08:00:00 24.47 via INTRAVENOUS

## 2018-11-30 LAB — TSH: TSH: 0.37 — AB (ref 0.41–5.90)

## 2018-12-05 LAB — HEMOGLOBIN A1C: Hemoglobin A1C: 6.4

## 2018-12-27 ENCOUNTER — Ambulatory Visit: Payer: Non-veteran care | Admitting: "Endocrinology

## 2019-01-03 ENCOUNTER — Ambulatory Visit (INDEPENDENT_AMBULATORY_CARE_PROVIDER_SITE_OTHER): Payer: Medicare Other | Admitting: "Endocrinology

## 2019-01-03 ENCOUNTER — Other Ambulatory Visit: Payer: Self-pay

## 2019-01-03 ENCOUNTER — Encounter (INDEPENDENT_AMBULATORY_CARE_PROVIDER_SITE_OTHER): Payer: Self-pay

## 2019-01-03 ENCOUNTER — Encounter: Payer: Self-pay | Admitting: "Endocrinology

## 2019-01-03 VITALS — BP 102/66 | HR 52 | Temp 97.7°F | Ht 74.5 in | Wt 199.4 lb

## 2019-01-03 DIAGNOSIS — E0590 Thyrotoxicosis, unspecified without thyrotoxic crisis or storm: Secondary | ICD-10-CM | POA: Diagnosis not present

## 2019-01-03 DIAGNOSIS — N184 Chronic kidney disease, stage 4 (severe): Secondary | ICD-10-CM

## 2019-01-03 DIAGNOSIS — E1122 Type 2 diabetes mellitus with diabetic chronic kidney disease: Secondary | ICD-10-CM

## 2019-01-03 NOTE — Progress Notes (Signed)
Endocrinology Consult Note    Subjective:    Patient ID: Bryan Rattan., male    DOB: 1951/04/12, PCP Sander Radon, NP.   Past Medical History:  Diagnosis Date  . Anemia   . Anxiety   . Arthritis   . Bipolar 1 disorder ([place])   . Chronic renal insufficiency    GFR 23  . Depression   . Diabetes ([place])   . Diabetes mellitus, type II ([place])   . GERD (gastroesophageal reflux disease)   . Gout   . Hypertension   . Sleep apnea    cannot tolerate CPAP    Past Surgical History:  Procedure Laterality Date  . BACK SURGERY  2006  . COLONOSCOPY WITH PROPOFOL N/A 09/07/2018   Procedure: COLONOSCOPY WITH PROPOFOL;  Surgeon: Toledo, Benay Pike, MD;  Location: [facility] ENDOSCOPY;  Service: Gastroenterology;  Laterality: N/A;  . ESOPHAGOGASTRODUODENOSCOPY (EGD) WITH PROPOFOL N/A 09/07/2018   Procedure: ESOPHAGOGASTRODUODENOSCOPY (EGD) WITH PROPOFOL;  Surgeon: Toledo, Benay Pike, MD;  Location: [facility] ENDOSCOPY;  Service: Gastroenterology;  Laterality: N/A;  . GASTRIC BYPASS  2008  . INGUINAL HERNIA REPAIR     age 62  . REVISION OF TOTAL SHOULDER  2017  . soft palate reduction    . TOTAL SHOULDER REPLACEMENT Right 2017  . TRANSURETHRAL RESECTION OF PROSTATE     X 2    Social History   Socioeconomic History  . Marital status: Married    Spouse name: Not on file  . Number of children: Not on file  . Years of education: Not on file  . Highest education level: Not on file  Occupational History  . Not on file  Social Needs  . Financial resource strain: Not on file  . Food insecurity    Worry: Not on file    Inability: Not on file  . Transportation needs    Medical: Not on file    Non-medical: Not on file  Tobacco Use  . Smoking status: Former Smoker    Packs/day: 1.00    Years: 32.00    Pack years: 32.00    Types: Cigarettes    Quit date: 01/12/1997    Years since quitting: 21.9  . Smokeless tobacco: Never Used  Substance and Sexual Activity  . Alcohol use: Yes   Comment: occas  . Drug use: Never  . Sexual activity: Yes    Birth control/protection: None  Lifestyle  . Physical activity    Days per week: Not on file    Minutes per session: Not on file  . Stress: Not on file  Relationships  . Social Herbalist on phone: Not on file    Gets together: Not on file    Attends religious service: Not on file    Active member of club or organization: Not on file    Attends meetings of clubs or organizations: Not on file    Relationship status: Not on file  Other Topics Concern  . Not on file  Social History Narrative  . Not on file    Family History  Problem Relation Age of Onset  . Rheum arthritis Mother   . Diabetes Mother   . Heart disease Father   . Colon cancer Neg Hx     Outpatient Encounter Medications as of 01/03/2019  Medication Sig  . ARIPiprazole (ABILIFY) 2 MG tablet Take 2 mg by mouth daily.  . calcitRIOL (ROCALTROL) 0.5 MCG capsule Take 0.5 mcg by mouth daily.  Marland Kitchen  clonazePAM (KLONOPIN) 0.5 MG tablet Take 0.5 mg by mouth 2 (two) times daily as needed for anxiety.  . famotidine (PEPCID) 20 MG tablet Take 20 mg by mouth 2 (two) times daily.  . furosemide (LASIX) 40 MG tablet Take 40 mg by mouth 2 (two) times daily.  Marland Kitchen gabapentin (NEURONTIN) 300 MG capsule Take 300 mg by mouth at bedtime.  Marland Kitchen glipiZIDE (GLUCOTROL) 5 MG tablet Take 5 mg by mouth daily before breakfast.  . loratadine (CLARITIN) 10 MG tablet Take 10 mg by mouth daily.  . ranitidine (ZANTAC) 150 MG tablet Take 300 mg by mouth 2 (two) times daily.  . [DISCONTINUED] atorvastatin (LIPITOR) 20 MG tablet Take 20 mg by mouth daily at 6 PM.  . [DISCONTINUED] B Complex Vitamins (VITAMIN B COMPLEX PO) Take 1 tablet by mouth daily.   . [DISCONTINUED] buPROPion (WELLBUTRIN SR) 150 MG 12 hr tablet Take 150 mg by mouth 2 (two) times daily.  . [DISCONTINUED] desvenlafaxine (PRISTIQ) 100 MG 24 hr tablet Take 100 mg by mouth at bedtime.   . [DISCONTINUED] ferrous sulfate 325  (65 FE) MG EC tablet Take 325 mg by mouth 2 (two) times daily with a meal.  . [DISCONTINUED] hydrALAZINE (APRESOLINE) 25 MG tablet Take 25 mg by mouth 2 (two) times daily.  . [DISCONTINUED] labetalol (NORMODYNE) 300 MG tablet Take 50 mg by mouth 2 (two) times daily.   . [DISCONTINUED] Lidocaine 5 % CREA Apply 300 g topically 3 (three) times daily as needed.  . [DISCONTINUED] PARoxetine (PAXIL) 20 MG tablet Take 20 mg by mouth daily.  . [DISCONTINUED] polyvinyl alcohol (ARTIFICIAL TEARS) 1.4 % ophthalmic solution Place 1 drop into both eyes daily as needed for dry eyes.  . [DISCONTINUED] QUEtiapine (SEROQUEL) 400 MG tablet Take 400 mg by mouth at bedtime.  . [DISCONTINUED] tamsulosin (FLOMAX) 0.4 MG CAPS capsule Take 0.4 mg by mouth daily.  . [DISCONTINUED] venlafaxine (EFFEXOR) 37.5 MG tablet Take 37.5 mg by mouth at bedtime.   No facility-administered encounter medications on file as of 01/03/2019.     ALLERGIES: Allergies  Allergen Reactions  . Iodinated Diagnostic Agents Hives and Itching  . Other Other (See Comments)    TERAMYCIN-trouble breathing  . Tape Hives and Rash  . Nsaids Other (See Comments)    Hx gastric bypass can not take nsaids     VACCINATION STATUS: Immunization History  Administered Date(s) Administered  . Influenza-Unspecified 01/12/2015     HPI  Bryan Flemmer. is 68 y.o. male who presents today with a medical history as above. he is being seen in consultation for hyperthyroidism requested by Gordy Levan, Pushpinder K, NP.  he denies any prior history of thyroid dysfunction.  He reports significant weight loss of approximately 50 pounds over 18 months, although he  explains this is due largely  to dietary changes. -His TSH on November 30, 2018 was slightly suppressed at 0.37.  He has well-controlled type 2 diabetes with recent A1c of 6.4%, currently on glipizide 5 mg p.o. daily. he denies dysphagia, choking, shortness of breath, no recent voice change.  He denies  palpitations, heat/cold intolerance. he  denies family history of thyroid dysfunction,  denies family hx of thyroid cancer. he denies personal history of goiter. he is not on any anti-thyroid medications nor on any thyroid hormone supplements. he  is willing to proceed with appropriate work up and therapy for thyrotoxicosis.  Review of systems  Constitutional: + weight loss, + fatigue, - subjective hyperthermia Eyes: no blurry vision, - xerophthalmia ENT: no sore throat, no nodules palpated in throat, no dysphagia/odynophagia, nor hoarseness Cardiovascular: no Chest Pain, no Shortness of Breath, -  palpitations, no leg swelling Respiratory: no cough, no SOB Gastrointestinal: no Nausea, no Vomiting, no Diarhhea Musculoskeletal: no muscle/joint aches Skin: no rashes Neurological: -  tremors, no numbness, no tingling, no dizziness Psychiatric: no depression, -  anxiety   Objective:    BP 102/66 (BP Location: Left Arm, Patient Position: Sitting, Cuff Size: Normal)   Pulse (!) 52   Temp 97.7 F (36.5 C) (Oral)   Ht 6' 2.5" (1.892 m)   Wt 199 lb 6.4 oz (90.4 kg)   SpO2 97%   BMI 25.26 kg/m   Wt Readings from Last 3 Encounters:  01/03/19 199 lb 6.4 oz (90.4 kg)  09/07/18 222 lb (100.7 kg)  01/12/18 246 lb 9.6 oz (111.9 kg)                                                Physical exam  Constitutional: Body mass index is 25.26 kg/m., not in acute distress, + stable state of mind Eyes: PERRLA, EOMI, - exophthalmos ENT: moist mucous membranes, -  thyromegaly, no cervical lymphadenopathy Cardiovascular: + Mild bradycardia of 52, normal precordial activity, + normal Rhythm, no Murmur/Rubs/Gallops Respiratory:  adequate breathing efforts, no gross chest deformity, Clear to auscultation bilaterally Gastrointestinal: abdomen soft, Non -tender, No distension, Bowel Sounds present Musculoskeletal: no gross deformities, strength intact in all four extremities Skin:  moist, warm, no rashes Neurological: + mild  tremor with outstretched hands,  - Deep Tendon Reflexes  on both lower extremities.   CMP     Component Value Date/Time   NA 142 01/12/2018 1254   K 3.6 01/12/2018 1254   CL 104 01/12/2018 1254   CO2 28 01/12/2018 1254   GLUCOSE 116 (H) 01/12/2018 1254   BUN 36 (H) 01/12/2018 1254   CREATININE 2.51 (H) 01/12/2018 1254   CALCIUM 8.6 (L) 01/12/2018 1254   GFRNONAA 25 (L) 01/12/2018 1254   GFRAA 30 (L) 01/12/2018 1254     CBC    Component Value Date/Time   WBC 7.4 01/12/2018 1254   RBC 4.25 01/12/2018 1254   HGB 13.9 01/12/2018 1254   HCT 40.8 01/12/2018 1254   PLT 253 01/12/2018 1254   MCV 96.0 01/12/2018 1254   MCH 32.7 01/12/2018 1254   MCHC 34.1 01/12/2018 1254   RDW 12.8 01/12/2018 1254     Diabetic Labs (most recent): Lab Results  Component Value Date   HGBA1C 6.4 12/05/2018    Lab Results  Component Value Date   TSH 0.37 (A) 11/30/2018      Assessment & Plan:   - hyperthyroidism  he is being seen at a kind request of Gill, Pushpinder K, NP. his history and most recent labs are reviewed, and he was examined clinically. -He only has subtle signs of thyrotoxicosis, in fact he has bradycardia.  He is not on beta-blockers.    I like to repeat full profile thyroid function tests today and confirmatory thyroid uptake and scan will be scheduled to be done as soon as possible.   Options of therapy are briefly discussed with him.  If he is confirmed to have primary hyperthyroidism, his  best option will be radioactive iodine therapy.  He understands the subsequent need for thyroid hormone replacement.      he will return in 10 days for treatment decision.  He will not need beta-blocker initiation today.  -Type 2 diabetes Recent A1c was 6.4% consistent with good control.  He would not need any additional treatment, advised to continue glipizide 5 mg p.o. daily at breakfast.  - I advised him to maintain close follow  up with Gordy Levan, Pushpinder K, NP for primary care needs.   - Time spent with the patient: 45 minutes, of which >50% was spent in obtaining information about his symptoms, reviewing his previous labs, evaluations, and treatments, counseling him about his subclinical hyperthyroidism, type 2 diabetes, and developing a plan to confirm the diagnosis and long term treatment as necessary. Please refer to " Patient Self Inventory" in the Media  tab for reviewed elements of pertinent patient history.  [place] participated in the discussions, expressed understanding, and voiced agreement with the above plans.  All questions were answered to his satisfaction. he is encouraged to contact clinic should he have any questions or concerns prior to his return visit.   Follow up plan: Return in about 10 days (around 01/13/2019) for Labs Today- Non-Fasting Ok, Follow up with Thyroid Uptake and Scan.   Thank you for involving me in the care of this pleasant patient, and I will continue to update you with his progress.  Glade Lloyd, MD [facility] Phone: [phone]  Fax: [phone]   01/03/2019, 9:47 AM  This note was partially dictated with voice recognition software. Similar sounding words can be transcribed inadequately or may not  be corrected upon review.

## 2019-01-04 LAB — TSH: TSH: 0.79 mIU/L (ref 0.40–4.50)

## 2019-01-04 LAB — THYROID PEROXIDASE ANTIBODY: Thyroperoxidase Ab SerPl-aCnc: <1 IU/mL (ref <9)

## 2019-01-04 LAB — T4, FREE: Free T4: 1.3 ng/dL (ref 0.8–1.8)

## 2019-01-04 LAB — T3, FREE: T3, Free: 3.6 pg/mL (ref 2.3–4.2)

## 2019-01-04 LAB — THYROGLOBULIN ANTIBODY: Thyroglobulin Ab: <1 IU/mL (ref <=1)

## 2019-01-17 ENCOUNTER — Telehealth: Payer: Self-pay | Admitting: "Endocrinology

## 2019-01-17 NOTE — Telephone Encounter (Signed)
Thyroid uptake/scan cancelled per Dr Dorris Fetch.

## 2019-01-18 ENCOUNTER — Encounter: Payer: Self-pay | Admitting: "Endocrinology

## 2019-01-18 ENCOUNTER — Other Ambulatory Visit: Payer: Self-pay

## 2019-01-18 ENCOUNTER — Ambulatory Visit (INDEPENDENT_AMBULATORY_CARE_PROVIDER_SITE_OTHER): Payer: Medicare Other | Admitting: "Endocrinology

## 2019-01-18 DIAGNOSIS — E0590 Thyrotoxicosis, unspecified without thyrotoxic crisis or storm: Secondary | ICD-10-CM

## 2019-01-18 NOTE — Progress Notes (Signed)
01/18/2019                                Endocrinology Telehealth Visit Follow up Note -During COVID -19 Pandemic  I connected with Bryan Cools Jr. on 01/18/2019   by telephone and verified that I am speaking with the correct person using two identifiers. Bryan [place]ston Wiater Jr., 1950-09-19. he has verbally consented to this visit. All issues noted in this document were discussed and addressed. The format was not optimal for physical exam.  Subjective:    Patient ID: Bryan Rattan., male    DOB: Jun 30, 1950, PCP Sander Radon, NP.   Past Medical History:  Diagnosis Date  . Anemia   . Anxiety   . Arthritis   . Bipolar 1 disorder ([place])   . Chronic renal insufficiency    GFR 23  . Depression   . Diabetes ([place])   . Diabetes mellitus, type II ([place])   . GERD (gastroesophageal reflux disease)   . Gout   . Hypertension   . Sleep apnea    cannot tolerate CPAP    Past Surgical History:  Procedure Laterality Date  . BACK SURGERY  2006  . COLONOSCOPY WITH PROPOFOL N/A 09/07/2018   Procedure: COLONOSCOPY WITH PROPOFOL;  Surgeon: Toledo, Benay Pike, MD;  Location: [facility] ENDOSCOPY;  Service: Gastroenterology;  Laterality: N/A;  . ESOPHAGOGASTRODUODENOSCOPY (EGD) WITH PROPOFOL N/A 09/07/2018   Procedure: ESOPHAGOGASTRODUODENOSCOPY (EGD) WITH PROPOFOL;  Surgeon: Toledo, Benay Pike, MD;  Location: [facility] ENDOSCOPY;  Service: Gastroenterology;  Laterality: N/A;  . GASTRIC BYPASS  2008  . INGUINAL HERNIA REPAIR     age 38  . REVISION OF TOTAL SHOULDER  2017  . soft palate reduction    . TOTAL SHOULDER REPLACEMENT Right 2017  . TRANSURETHRAL RESECTION OF PROSTATE     X 2    Social History   Socioeconomic History  . Marital status: Married    Spouse name: Not on file  . Number of children: Not on file  . Years of education: Not on file  . Highest education level: Not on file  Occupational History  . Not on file  Social Needs  . Financial resource strain: Not on file  . Food  insecurity    Worry: Not on file    Inability: Not on file  . Transportation needs    Medical: Not on file    Non-medical: Not on file  Tobacco Use  . Smoking status: Former Smoker    Packs/day: 1.00    Years: 32.00    Pack years: 32.00    Types: Cigarettes    Quit date: 01/12/1997    Years since quitting: 22.0  . Smokeless tobacco: Never Used  Substance and Sexual Activity  . Alcohol use: Yes    Comment: occas  . Drug use: Never  . Sexual activity: Yes    Birth control/protection: None  Lifestyle  . Physical activity    Days per week: Not on file    Minutes per session: Not on file  . Stress: Not on file  Relationships  . Social Herbalist on phone: Not on file    Gets together: Not on file    Attends religious service: Not on file    Active member of club or organization: Not on file    Attends meetings of clubs or organizations: Not on file    Relationship status:  Not on file  Other Topics Concern  . Not on file  Social History Narrative  . Not on file    Family History  Problem Relation Age of Onset  . Rheum arthritis Mother   . Diabetes Mother   . Heart disease Father   . Colon cancer Neg Hx     Outpatient Encounter Medications as of 01/18/2019  Medication Sig  . ARIPiprazole (ABILIFY) 2 MG tablet Take 2 mg by mouth daily.  . calcitRIOL (ROCALTROL) 0.5 MCG capsule Take 0.5 mcg by mouth daily.  . clonazePAM (KLONOPIN) 0.5 MG tablet Take 0.5 mg by mouth 2 (two) times daily as needed for anxiety.  . famotidine (PEPCID) 20 MG tablet Take 20 mg by mouth 2 (two) times daily.  . furosemide (LASIX) 40 MG tablet Take 40 mg by mouth 2 (two) times daily.  Marland Kitchen gabapentin (NEURONTIN) 300 MG capsule Take 300 mg by mouth at bedtime.  Marland Kitchen glipiZIDE (GLUCOTROL) 5 MG tablet Take 5 mg by mouth daily before breakfast.  . loratadine (CLARITIN) 10 MG tablet Take 10 mg by mouth daily.  . ranitidine (ZANTAC) 150 MG tablet Take 300 mg by mouth 2 (two) times daily.   No  facility-administered encounter medications on file as of 01/18/2019.     ALLERGIES: Allergies  Allergen Reactions  . Iodinated Diagnostic Agents Hives and Itching  . Other Other (See Comments)    TERAMYCIN-trouble breathing  . Tape Hives and Rash  . Nsaids Other (See Comments)    Hx gastric bypass can not take nsaids     VACCINATION STATUS: Immunization History  Administered Date(s) Administered  . Influenza-Unspecified 01/12/2015     HPI  Bryan Lesniak. is 68 y.o. male who presents today with a medical history as above. he is being engaged in telehealth via telephone after he was seen in consultation for hyperthyroidism requested by Gordy Levan, Pushpinder K, NP.  he denies any prior history of thyroid dysfunction.  He reports significant weight loss of approximately 50 pounds over 18 months, although he  explains this is due largely  to dietary changes. -His TSH on November 30, 2018 was slightly suppressed at 0.37.  On the day of his last visit, he underwent for profile thyroid function test which was completely normal.  This necessitated cancellation of his order for thyroid uptake and scan.  He does not have any new complaints today.    He has well-controlled type 2 diabetes with recent A1c of 6.4%, currently on glipizide 5 mg p.o. daily. he denies dysphagia, choking, shortness of breath, no recent voice change.  He denies palpitations, heat/cold intolerance. he  denies family history of thyroid dysfunction,  denies family hx of thyroid cancer. he denies personal history of goiter. he is not on any anti-thyroid medications nor on any thyroid hormone supplements. he  is willing to proceed with appropriate work up and therapy for thyrotoxicosis.                           Review of systems  Limited as above.   Objective:    There were no vitals taken for this visit.  Wt Readings from Last 3 Encounters:  01/03/19 199 lb 6.4 oz (90.4 kg)  09/07/18 222 lb (100.7 kg)  01/12/18 246  lb 9.6 oz (111.9 kg)  Physical exam   CMP     Component Value Date/Time   NA 142 01/12/2018 1254   K 3.6 01/12/2018 1254   CL 104 01/12/2018 1254   CO2 28 01/12/2018 1254   GLUCOSE 116 (H) 01/12/2018 1254   BUN 36 (H) 01/12/2018 1254   CREATININE 2.51 (H) 01/12/2018 1254   CALCIUM 8.6 (L) 01/12/2018 1254   GFRNONAA 25 (L) 01/12/2018 1254   GFRAA 30 (L) 01/12/2018 1254     CBC    Component Value Date/Time   WBC 7.4 01/12/2018 1254   RBC 4.25 01/12/2018 1254   HGB 13.9 01/12/2018 1254   HCT 40.8 01/12/2018 1254   PLT 253 01/12/2018 1254   MCV 96.0 01/12/2018 1254   MCH 32.7 01/12/2018 1254   MCHC 34.1 01/12/2018 1254   RDW 12.8 01/12/2018 1254     Diabetic Labs (most recent): Lab Results  Component Value Date   HGBA1C 6.4 12/05/2018    Lab Results  Component Value Date   TSH 0.79 01/03/2019   TSH 0.37 (A) 11/30/2018   FREET4 1.3 01/03/2019      Assessment & Plan:   - hyperthyroidism-resolved  His previsit thyroid function tests are within normal limits.  He does not need thyroid uptake and scan of the work-up.  He does not need any antithyroid intervention at this time. He will have repeat thyroid function test and office visit in 1 year.  Marland Kitchen  He will not need beta-blocker initiation today.  -Type 2 diabetes Recent A1c was 6.4% consistent with good control.  He would not need any additional treatment, advised to continue glipizide 5 mg p.o. daily at breakfast.  - I advised him to maintain close follow up with Gordy Levan, Pushpinder K, NP for primary care needs.    Time for this visit: 15 minutes. Bryan Manning  participated in the discussions, expressed understanding, and voiced agreement with the above plans.  All questions were answered to his satisfaction. he is encouraged to contact clinic should he have any questions or concerns prior to his return visit.   Follow up plan: Return in about 1 year  (around 01/18/2020) for Follow up with Pre-visit Labs.   Thank you for involving me in the care of this pleasant patient, and I will continue to update you with his progress.  Glade Lloyd, MD [facility] [place] [facility] Phone: [phone]  Fax: [phone]   01/18/2019, 10:52 AM  This note was partially dictated with voice recognition software. Similar sounding words can be transcribed inadequately or may not  be corrected upon review.

## 2019-01-26 ENCOUNTER — Other Ambulatory Visit (HOSPITAL_COMMUNITY): Payer: No Typology Code available for payment source

## 2019-01-27 ENCOUNTER — Other Ambulatory Visit (HOSPITAL_COMMUNITY): Payer: No Typology Code available for payment source

## 2019-07-27 ENCOUNTER — Other Ambulatory Visit: Payer: Self-pay | Admitting: Internal Medicine

## 2019-07-27 DIAGNOSIS — Z9884 Bariatric surgery status: Secondary | ICD-10-CM

## 2019-07-27 DIAGNOSIS — R112 Nausea with vomiting, unspecified: Secondary | ICD-10-CM

## 2019-08-03 ENCOUNTER — Other Ambulatory Visit: Payer: No Typology Code available for payment source

## 2019-08-04 ENCOUNTER — Ambulatory Visit
Admission: RE | Admit: 2019-08-04 | Discharge: 2019-08-04 | Disposition: A | Discharge status: Home / Self Care | Payer: Medicare Other | Source: Ambulatory Visit | Attending: Internal Medicine | Admitting: Internal Medicine

## 2019-08-04 ENCOUNTER — Other Ambulatory Visit: Payer: Self-pay

## 2019-08-04 DIAGNOSIS — R112 Nausea with vomiting, unspecified: Secondary | ICD-10-CM | POA: Diagnosis present

## 2019-08-04 DIAGNOSIS — Z9884 Bariatric surgery status: Secondary | ICD-10-CM | POA: Insufficient documentation

## 2020-01-12 ENCOUNTER — Other Ambulatory Visit: Payer: Self-pay

## 2020-01-12 DIAGNOSIS — E0590 Thyrotoxicosis, unspecified without thyrotoxic crisis or storm: Secondary | ICD-10-CM

## 2020-01-16 LAB — T4, FREE: Free T4: 1.54 ng/dL (ref 0.82–1.77)

## 2020-01-16 LAB — TSH: TSH: 0.702 u[IU]/mL (ref 0.450–4.500)

## 2020-01-18 ENCOUNTER — Encounter: Payer: Self-pay | Admitting: "Endocrinology

## 2020-01-18 ENCOUNTER — Telehealth (INDEPENDENT_AMBULATORY_CARE_PROVIDER_SITE_OTHER): Payer: Medicare Other | Admitting: "Endocrinology

## 2020-01-18 DIAGNOSIS — E0590 Thyrotoxicosis, unspecified without thyrotoxic crisis or storm: Secondary | ICD-10-CM | POA: Diagnosis not present

## 2020-01-18 DIAGNOSIS — E1122 Type 2 diabetes mellitus with diabetic chronic kidney disease: Secondary | ICD-10-CM | POA: Diagnosis not present

## 2020-01-18 DIAGNOSIS — N184 Chronic kidney disease, stage 4 (severe): Secondary | ICD-10-CM

## 2020-01-18 NOTE — Progress Notes (Signed)
01/18/2020                                        Endocrinology Telehealth Visit Follow up Note -During COVID -19 Pandemic  This visit type was conducted  via telephone due to national recommendations for restrictions regarding the COVID-19 Pandemic  in an effort to limit this patient's exposure and mitigate transmission of the corona virus.   I connected with Bryan Rattan. on 01/18/2020   by telephone and verified that I am speaking with the correct person using two identifiers. Bryan Genova Longie Jr., 11-09-1950. he has verbally consented to this visit.  I was in my office and patient was in his residence. No other persons were with me during the encounter. All issues noted in this document were discussed and addressed. The format was not optimal for physical exam.    Subjective:    Patient ID: Bryan Rattan., male    DOB: Mar 28, 1951, PCP Ludwig Clarks, FNP.   Past Medical History:  Diagnosis Date  . Anemia   . Anxiety   . Arthritis   . Bipolar 1 disorder ([place])   . Chronic renal insufficiency    GFR 23  . Depression   . Diabetes ([place])   . Diabetes mellitus, type II ([place])   . GERD (gastroesophageal reflux disease)   . Gout   . Hypertension   . Sleep apnea    cannot tolerate CPAP    Past Surgical History:  Procedure Laterality Date  . BACK SURGERY  2006  . COLONOSCOPY WITH PROPOFOL N/A 09/07/2018   Procedure: COLONOSCOPY WITH PROPOFOL;  Surgeon: Toledo, Benay Pike, MD;  Location: [facility] ENDOSCOPY;  Service: Gastroenterology;  Laterality: N/A;  . ESOPHAGOGASTRODUODENOSCOPY (EGD) WITH PROPOFOL N/A 09/07/2018   Procedure: ESOPHAGOGASTRODUODENOSCOPY (EGD) WITH PROPOFOL;  Surgeon: Toledo, Benay Pike, MD;  Location: [facility] ENDOSCOPY;  Service: Gastroenterology;  Laterality: N/A;  . GASTRIC BYPASS  2008  . INGUINAL HERNIA REPAIR     age 38  . REVISION OF TOTAL SHOULDER  2017  . soft palate reduction    . TOTAL SHOULDER REPLACEMENT Right 2017  . TRANSURETHRAL RESECTION OF  PROSTATE     X 2    Social History   Socioeconomic History  . Marital status: Married    Spouse name: Not on file  . Number of children: Not on file  . Years of education: Not on file  . Highest education level: Not on file  Occupational History  . Not on file  Tobacco Use  . Smoking status: Former Smoker    Packs/day: 1.00    Years: 32.00    Pack years: 32.00    Types: Cigarettes    Quit date: 01/12/1997    Years since quitting: 23.0  . Smokeless tobacco: Never Used  Vaping Use  . Vaping Use: Never used  Substance and Sexual Activity  . Alcohol use: Yes    Comment: occas  . Drug use: Never  . Sexual activity: Yes    Birth control/protection: None  Other Topics Concern  . Not on file  Social History Narrative  . Not on file   Social Determinants of Health   Financial Resource Strain:   . Difficulty of Paying Living Expenses: Not on file  Food Insecurity:   . Worried About Charity fundraiser in the Last Year: Not on file  . Ran  Out of Food in the Last Year: Not on file  Transportation Needs:   . Lack of Transportation (Medical): Not on file  . Lack of Transportation (Non-Medical): Not on file  Physical Activity:   . Days of Exercise per Week: Not on file  . Minutes of Exercise per Session: Not on file  Stress:   . Feeling of Stress : Not on file  Social Connections:   . Frequency of Communication with Friends and Family: Not on file  . Frequency of Social Gatherings with Friends and Family: Not on file  . Attends Religious Services: Not on file  . Active Member of Clubs or Organizations: Not on file  . Attends Archivist Meetings: Not on file  . Marital Status: Not on file    Family History  Problem Relation Age of Onset  . Rheum arthritis Mother   . Diabetes Mother   . Heart disease Father   . Colon cancer Neg Hx     Outpatient Encounter Medications as of 01/18/2020  Medication Sig  . ARIPiprazole (ABILIFY) 2 MG tablet Take 2 mg by mouth  daily.  . calcitRIOL (ROCALTROL) 0.5 MCG capsule Take 0.5 mcg by mouth daily.  . clonazePAM (KLONOPIN) 0.5 MG tablet Take 0.5 mg by mouth 2 (two) times daily as needed for anxiety.  . famotidine (PEPCID) 20 MG tablet Take 20 mg by mouth 2 (two) times daily.  . furosemide (LASIX) 40 MG tablet Take 40 mg by mouth 2 (two) times daily.  Marland Kitchen gabapentin (NEURONTIN) 300 MG capsule Take 300 mg by mouth at bedtime.  Marland Kitchen glipiZIDE (GLUCOTROL) 5 MG tablet Take 5 mg by mouth daily before breakfast.  . loratadine (CLARITIN) 10 MG tablet Take 10 mg by mouth daily.  . ranitidine (ZANTAC) 150 MG tablet Take 300 mg by mouth 2 (two) times daily.   No facility-administered encounter medications on file as of 01/18/2020.    ALLERGIES: Allergies  Allergen Reactions  . Iodinated Diagnostic Agents Hives and Itching  . Other Other (See Comments)    TERAMYCIN-trouble breathing  . Tape Hives and Rash  . Nsaids Other (See Comments)    Hx gastric bypass can not take nsaids     VACCINATION STATUS: Immunization History  Administered Date(s) Administered  . Influenza-Unspecified 01/12/2015     HPI  Bryan Cerro. is 69 y.o. male who presents today with a medical history as above. he is being engaged in telehealth via telephone for follow-up of subclinical hyperthyroidism.  PCP: Ludwig Clarks, FNP.  He is not on any antithyroid intervention. he denies any prior history of thyroid dysfunction.  He reports steady weight after he experienced weight loss of 50 pounds prior to last visit.  He explained that weight loss was due to dietary changes.   -His TSH on November 30, 2018 was slightly suppressed at 0.37.  On the day of his last visit, he underwent for profile thyroid function test which was completely normal.  The repeat labs before this visit were also normal.  He does not have any new complaints today.    He has well-controlled type 2 diabetes with recent A1c of 6.4%, currently on glipizide 5 mg p.o.  daily. he denies dysphagia, choking, shortness of breath, no recent voice change.  He denies palpitations, heat/cold intolerance. he  denies family history of thyroid dysfunction,  denies family hx of thyroid cancer. he denies personal history of goiter.  Review of systems  Limited as above.   Objective:    There were no vitals taken for this visit.  Wt Readings from Last 3 Encounters:  01/03/19 199 lb 6.4 oz (90.4 kg)  09/07/18 222 lb (100.7 kg)  01/12/18 246 lb 9.6 oz (111.9 kg)                                                Physical exam   CMP     Component Value Date/Time   NA 142 01/12/2018 1254   K 3.6 01/12/2018 1254   CL 104 01/12/2018 1254   CO2 28 01/12/2018 1254   GLUCOSE 116 (H) 01/12/2018 1254   BUN 36 (H) 01/12/2018 1254   CREATININE 2.51 (H) 01/12/2018 1254   CALCIUM 8.6 (L) 01/12/2018 1254   GFRNONAA 25 (L) 01/12/2018 1254   GFRAA 30 (L) 01/12/2018 1254     CBC    Component Value Date/Time   WBC 7.4 01/12/2018 1254   RBC 4.25 01/12/2018 1254   HGB 13.9 01/12/2018 1254   HCT 40.8 01/12/2018 1254   PLT 253 01/12/2018 1254   MCV 96.0 01/12/2018 1254   MCH 32.7 01/12/2018 1254   MCHC 34.1 01/12/2018 1254   RDW 12.8 01/12/2018 1254     Diabetic Labs (most recent): Lab Results  Component Value Date   HGBA1C 6.4 12/05/2018    Lab Results  Component Value Date   TSH 0.702 01/15/2020   TSH 0.79 01/03/2019   TSH 0.37 (A) 11/30/2018   FREET4 1.54 01/15/2020   FREET4 1.3 01/03/2019      Assessment & Plan:   - hyperthyroidism-resolved  His previsit thyroid function tests remain consistent with euthyroid state.   He does not need antithyroid intervention at this time.  He will have repeat TSH/free T4 in a year and visit as needed.   -  He will not need beta-blocker initiation today.  -Type 2 diabetes Recent A1c was 6.4% consistent with good control.  He is advised to continue glipizide 5 mg p.o. daily at  breakfast.  - I advised him to maintain close follow up with Ludwig Clarks, FNP for primary care needs.     - Time spent on this patient care encounter:  20 minutes of which 50% was spent in  counseling and the rest reviewing  his current and  previous labs / studies and medications  doses and developing a plan for long term care. Bryan Manning  participated in the discussions, expressed understanding, and voiced agreement with the above plans.  All questions were answered to his satisfaction. he is encouraged to contact clinic should he have any questions or concerns prior to his return visit.    Follow up plan: Return If labs become abnormal again.   Thank you for involving me in the care of this pleasant patient, and I will continue to update you with his progress.  Glade Lloyd, MD [facility] [place] [facility] Phone: [phone]  Fax: [phone]   01/18/2020, 8:35 AM  This note was partially dictated with voice recognition software. Similar sounding words can be transcribed inadequately or may not  be corrected upon review.

## 2021-02-02 IMAGING — RF DG UGI W/ SMALL BOWEL
15 of 18 series · 15 of 18 positions shown · non-contrast
Comparison: No prior.

CLINICAL DATA: Severe nausea.  History of Roux-en-Y gastric bypass.

EXAM:
UPPER GI SERIES WITH SMALL BOWEL FOLLOW-THROUGH
FLUOROSCOPY TIME:  Fluoroscopy Time:  3 minutes 30 seconds
Radiation Exposure Index (if provided by the fluoroscopic device):
200.8 mGy
TECHNIQUE: Standard single-contrast upper GI and small-bowel follow-through
obtained.

[Series 1: t abdomen supine · 0.15mm/px · 1 of 1 slices shown (1 of 4)]
[im 1/1]
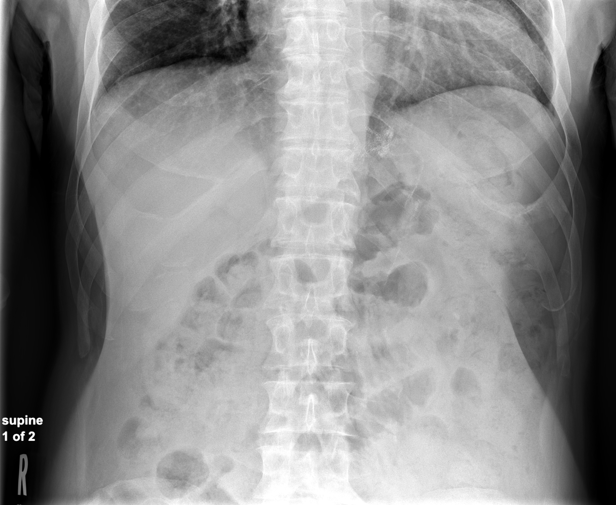

[Series 2: t abdomen supine · 0.15mm/px · 1 of 1 slices shown (2 of 4)]
[im 1/1]
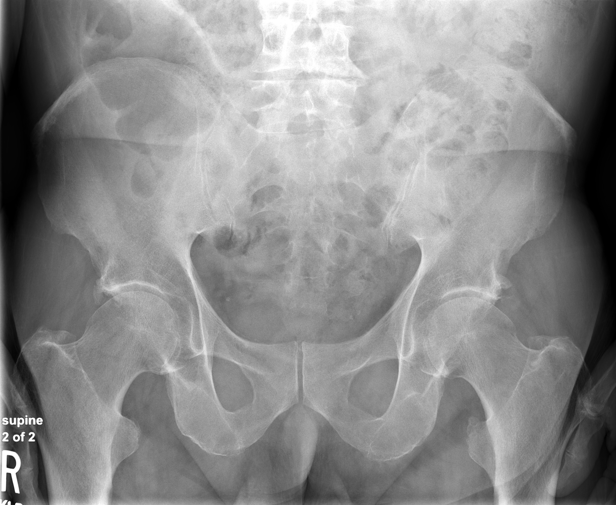

[Series 4: fluoro_barium singleshot_bw · 0.18mm/px · 1 of 1 slices shown (1 of 11)]
[im 1/1]
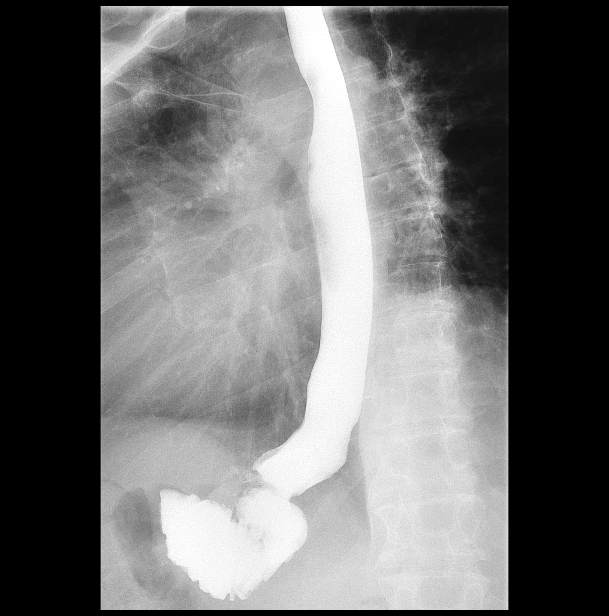

[Series 5: fluoro_barium singleshot_bw · 0.18mm/px · 1 of 1 slices shown (2 of 11)]
[im 1/1]
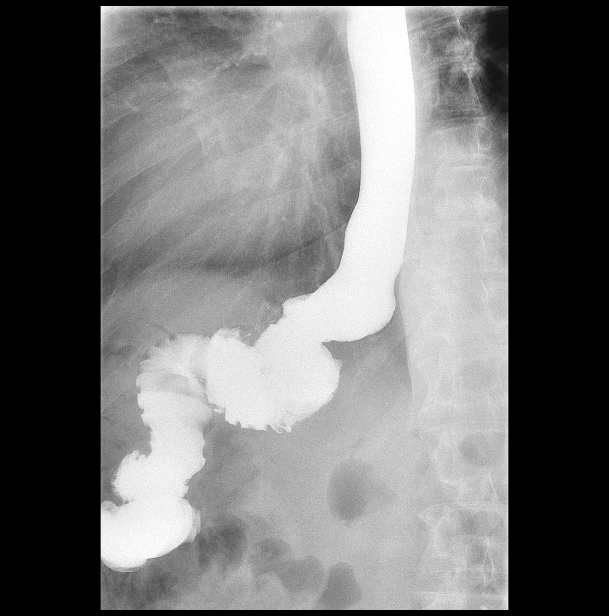

[Series 6: fluoro_barium singleshot_bw · 0.18mm/px · 1 of 1 slices shown (3 of 11)]
[im 1/1]
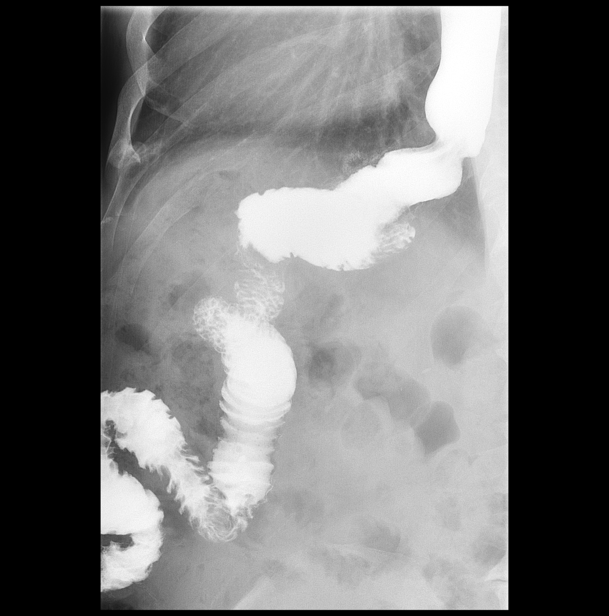

[Series 7: fluoro_barium singleshot_bw · 0.18mm/px · 1 of 1 slices shown (4 of 11)]
[im 1/1]
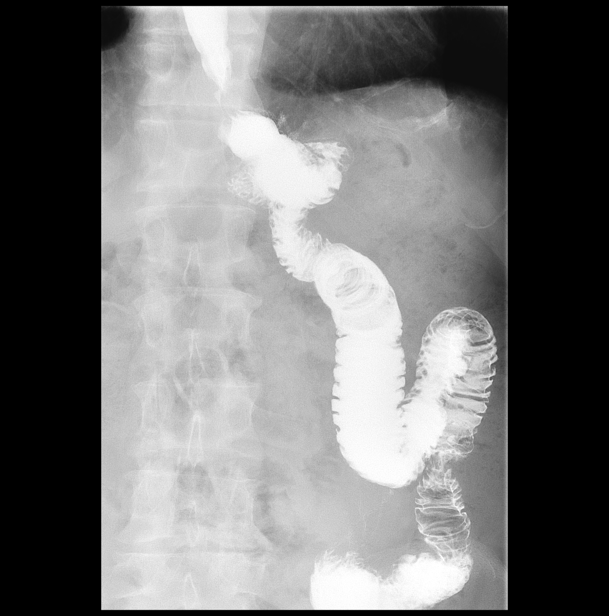

[Series 8: fluoro_barium singleshot_bw · 0.18mm/px · 1 of 1 slices shown (5 of 11)]
[im 1/1]
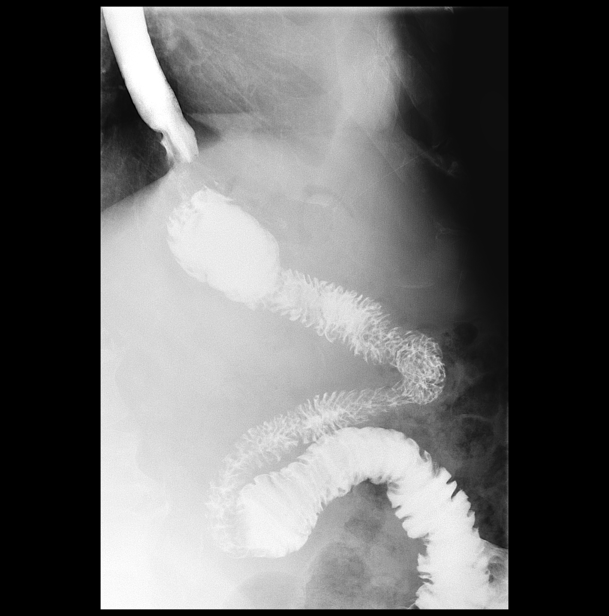

[Series 10: fluoro_barium singleshot_bw · 0.18mm/px · 1 of 1 slices shown (6 of 11)]
[im 1/1]
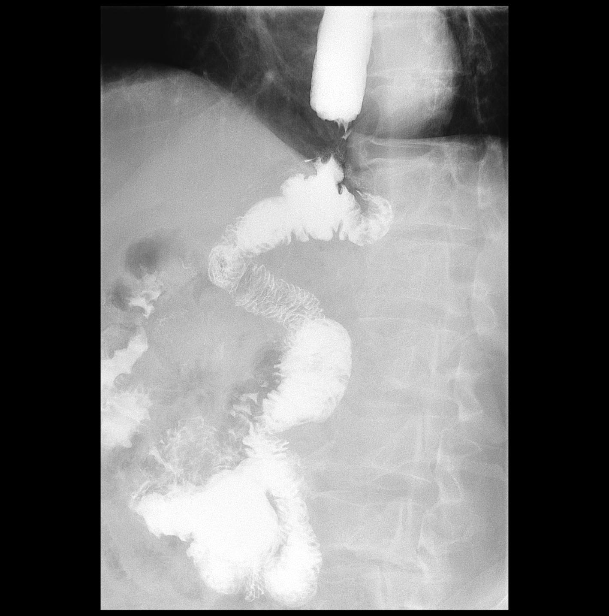

[Series 11: t abdomen supine · 0.15mm/px · 1 of 1 slices shown (3 of 4)]
[im 1/1]
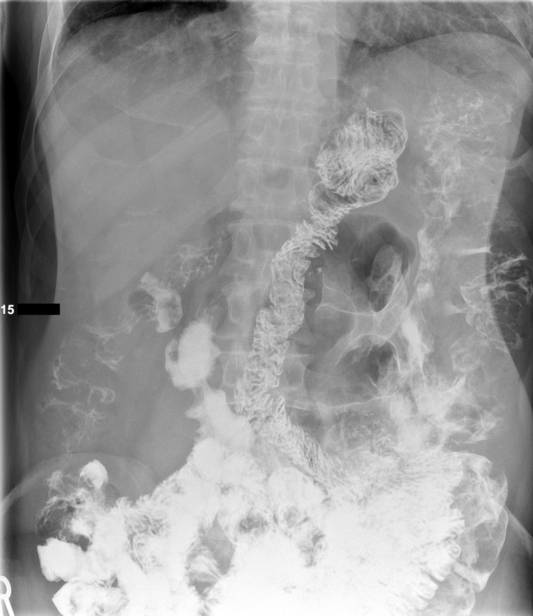

[Series 12: t abdomen supine · 0.15mm/px · 1 of 1 slices shown (4 of 4)]
[im 1/1]
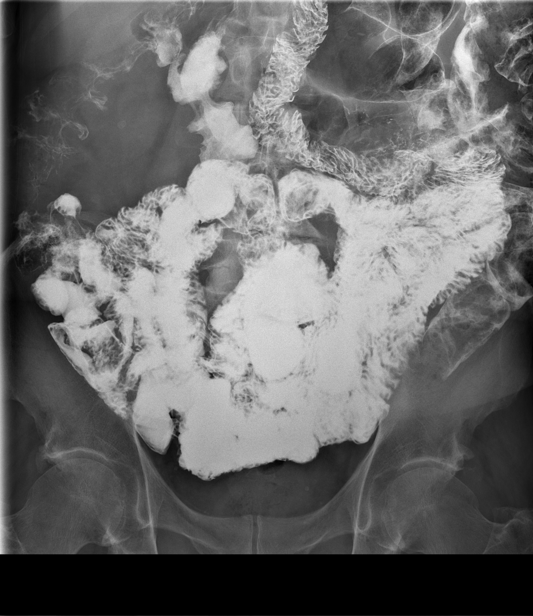

[Series 13: fluoro_barium singleshot_bw · 0.20mm/px · 1 of 1 slices shown (7 of 11)]
[im 1/1]
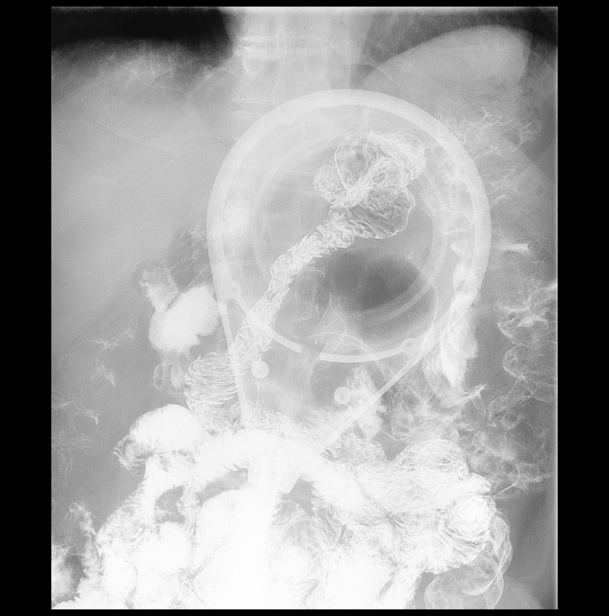

[Series 14: fluoro_barium singleshot_bw · 0.20mm/px · 1 of 1 slices shown (8 of 11)]
[im 1/1]
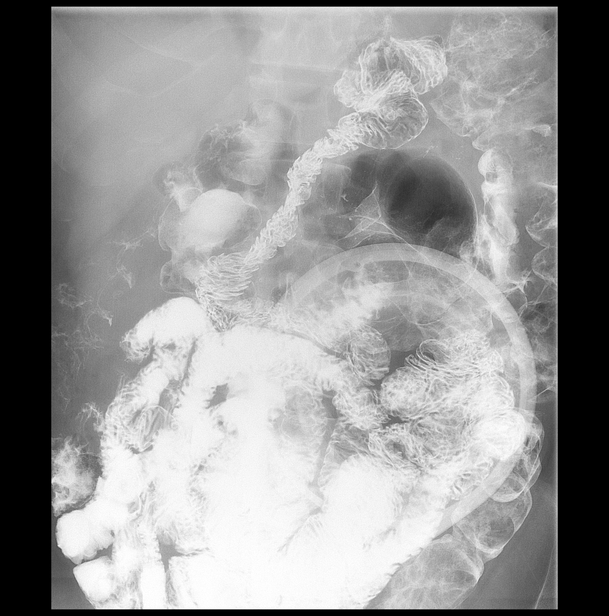

[Series 16: fluoro_barium singleshot_bw · 0.20mm/px · 1 of 1 slices shown (9 of 11)]
[im 1/1]
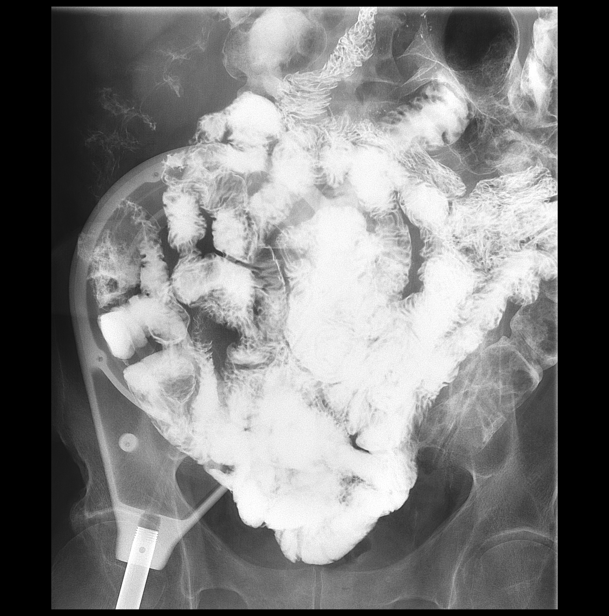

[Series 17: fluoro_barium singleshot_bw · 0.20mm/px · 1 of 1 slices shown (10 of 11)]
[im 1/1]
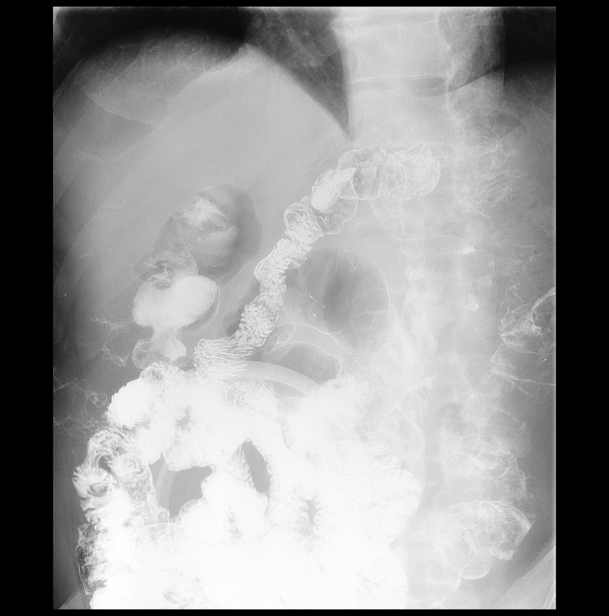

[Series 18: fluoro_barium singleshot_bw · 0.20mm/px · 1 of 1 slices shown (11 of 11)]
[im 1/1]
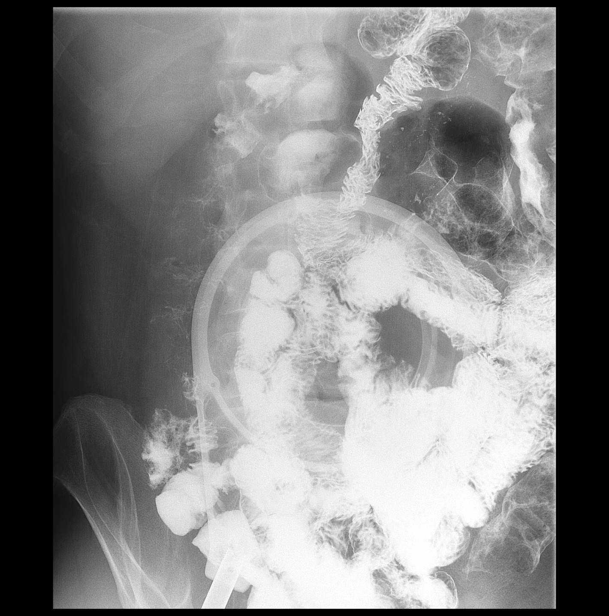

[15 of 18 positions shown; findings below may reference images not displayed]

FINDINGS: Thoracic esophagus is normal. Gastric bypass with Roux-en-Y noted.
Anastomosis appears to be intact. No barium leakage noted. No
evidence of anastomotic stricture. No evidence of ulceration.
Small-bowel fold thickness and caliber normal. No evidence of
small-bowel obstruction. Terminal ileal region appears normal.
IMPRESSION: Gastric bypass with Roux-en-Y. No focal abnormalities identified.
Small bowel appears normal.

## 2021-06-18 ENCOUNTER — Other Ambulatory Visit: Payer: Self-pay

## 2021-06-18 ENCOUNTER — Ambulatory Visit (INDEPENDENT_AMBULATORY_CARE_PROVIDER_SITE_OTHER): Payer: Medicare Other | Admitting: Dermatology

## 2021-06-18 ENCOUNTER — Encounter: Payer: Self-pay | Admitting: Dermatology

## 2021-06-18 DIAGNOSIS — Z85828 Personal history of other malignant neoplasm of skin: Secondary | ICD-10-CM | POA: Diagnosis not present

## 2021-06-18 DIAGNOSIS — Z1283 Encounter for screening for malignant neoplasm of skin: Secondary | ICD-10-CM

## 2021-06-18 DIAGNOSIS — L820 Inflamed seborrheic keratosis: Secondary | ICD-10-CM | POA: Diagnosis not present

## 2021-06-18 DIAGNOSIS — L578 Other skin changes due to chronic exposure to nonionizing radiation: Secondary | ICD-10-CM | POA: Diagnosis not present

## 2021-06-18 DIAGNOSIS — D229 Melanocytic nevi, unspecified: Secondary | ICD-10-CM | POA: Diagnosis not present

## 2021-06-18 DIAGNOSIS — L570 Actinic keratosis: Secondary | ICD-10-CM | POA: Diagnosis not present

## 2021-06-18 DIAGNOSIS — D1800 Hemangioma unspecified site: Secondary | ICD-10-CM

## 2021-06-18 DIAGNOSIS — L821 Other seborrheic keratosis: Secondary | ICD-10-CM

## 2021-06-18 DIAGNOSIS — L814 Other melanin hyperpigmentation: Secondary | ICD-10-CM

## 2021-06-18 NOTE — Patient Instructions (Signed)

## 2021-06-18 NOTE — Progress Notes (Signed)
? ?New Patient Visit ? ?Subjective  ?Bryan Manning. is a 71 y.o. male who presents for the following: Other (Scaly spot of scalp the are "touchy and peel"). ?The patient has spots, moles and lesions to be evaluated, some may be new or changing and the patient has concerns that these could be cancer. ? ?The following portions of the chart were reviewed this encounter and updated as appropriate:  ? Tobacco  Allergies  Meds  Problems  Med Hx  Surg Hx  Fam Hx   ?  ?Review of Systems:  No other skin or systemic complaints except as noted in HPI or Assessment and Plan. ? ?Objective  ?Well appearing patient in no apparent distress; mood and affect are within normal limits. ? ?A focused examination was performed including face, scalp, chest, back. Relevant physical exam findings are noted in the Assessment and Plan. ? ?Scalp (3) ?Erythematous thin papules/macules with gritty scale.  ? ?Scalp (5) ?Erythematous stuck-on, waxy papule or plaque ? ? ?Assessment & Plan  ? ?History of Basal Cell Carcinoma of the Skin ?- No evidence of recurrence today ?- Recommend regular full body skin exams ?- Recommend daily broad spectrum sunscreen SPF 30+ to sun-exposed areas, reapply every 2 hours as needed.  ?- Call if any new or changing lesions are noted between office visits ? ?Lentigines ?- Scattered tan macules ?- Due to sun exposure ?- Benign-appearing, observe ?- Recommend daily broad spectrum sunscreen SPF 30+ to sun-exposed areas, reapply every 2 hours as needed. ?- Call for any changes ? ?Seborrheic Keratoses ?- Stuck-on, waxy, tan-brown papules and/or plaques  ?- Benign-appearing ?- Discussed benign etiology and prognosis. ?- Observe ?- Call for any changes ? ?Melanocytic Nevi ?- Tan-brown and/or pink-flesh-colored symmetric macules and papules ?- Benign appearing on exam today ?- Observation ?- Call clinic for new or changing moles ?- Recommend daily use of broad spectrum spf 30+ sunscreen to sun-exposed areas.   ? ?Hemangiomas ?- Red papules ?- Discussed benign nature ?- Observe ?- Call for any changes ? ?Actinic Damage ?- Chronic condition, secondary to cumulative UV/sun exposure ?- diffuse scaly erythematous macules with underlying dyspigmentation ?- Recommend daily broad spectrum sunscreen SPF 30+ to sun-exposed areas, reapply every 2 hours as needed.  ?- Staying in the shade or wearing long sleeves, sun glasses (UVA+UVB protection) and wide brim hats (4-inch brim around the entire circumference of the hat) are also recommended for sun protection.  ?- Call for new or changing lesions. ? ?Skin cancer screening performed today.  ? ?AK (actinic keratosis) (3) ?Scalp ? ?Destruction of lesion - Scalp ?Complexity: simple   ?Destruction method: cryotherapy   ?Informed consent: discussed and consent obtained   ?Timeout:  patient name, date of birth, surgical site, and procedure verified ?Lesion destroyed using liquid nitrogen: Yes   ?Region frozen until ice ball extended beyond lesion: Yes   ?Outcome: patient tolerated procedure well with no complications   ?Post-procedure details: wound care instructions given   ? ?Inflamed seborrheic keratosis (5) ?Scalp ? ?Destruction of lesion - Scalp ?Complexity: simple   ?Destruction method: cryotherapy   ?Informed consent: discussed and consent obtained   ?Timeout:  patient name, date of birth, surgical site, and procedure verified ?Lesion destroyed using liquid nitrogen: Yes   ?Region frozen until ice ball extended beyond lesion: Yes   ?Outcome: patient tolerated procedure well with no complications   ?Post-procedure details: wound care instructions given   ? ? ?Return in about 6 months (around 12/19/2021) for  AK follow up. ? ?I, Ashok Cordia, CMA, am acting as scribe for Sarina Ser, MD . ?Documentation: I have reviewed the above documentation for accuracy and completeness, and I agree with the above. ? ?Sarina Ser, MD ? ? ?

## 2021-06-22 ENCOUNTER — Encounter: Payer: Self-pay | Admitting: Dermatology

## 2021-09-18 ENCOUNTER — Other Ambulatory Visit: Payer: Self-pay | Admitting: *Deleted

## 2021-09-18 DIAGNOSIS — N183 Chronic kidney disease, stage 3 unspecified: Secondary | ICD-10-CM | POA: Insufficient documentation

## 2021-09-18 DIAGNOSIS — I130 Hypertensive heart and chronic kidney disease with heart failure and stage 1 through stage 4 chronic kidney disease, or unspecified chronic kidney disease: Secondary | ICD-10-CM | POA: Insufficient documentation

## 2021-09-18 NOTE — Patient Outreach (Signed)
[facility]) Care Management Telephonic RN Care Manager Note   10/02/2021 Name:  Bryan Manning. MRN:  [id number] DOB:  20-Apr-1950  Summary: Successful outreach with assessment and no needs identified, agrees to future follow up outreach  Bryan Manning denies dx of heart failure and request the removal of the heart failure dx  He reports he is seen by his Pcp every 3 - 6 months 6.4 HgA1c known lab 08/18/21    Recommendations/Changes made from today's visit: Initial assessment completed without identified needs  Reviewed [facility] program, progression and pt agreed to future outreach - scheduled   Subjective: Bryan Manning. is an 71 y.o. year old male who is a primary patient of Ludwig Clarks, [place]. The care management team was consulted for assistance with care management and/or care coordination needs.    Telephonic RN Care Manager completed Telephone Visit today.   Objective:  Medications Reviewed Today     Reviewed by Ralene Bathe, MD (Physician) on 06/22/21 at 1924  Med List Status: <None>   Medication Order Taking? Sig Documenting Provider Last Dose Status Informant  ARIPiprazole (ABILIFY) 2 MG tablet [id number] No Take 2 mg by mouth daily. [provider] 09/06/2018 Unknown time Active   calcitRIOL (ROCALTROL) 0.5 MCG capsule [id number] No Take 0.5 mcg by mouth daily. [provider] Past Week Unknown time Active Self  clonazePAM (KLONOPIN) 0.5 MG tablet [id number] No Take 0.5 mg by mouth 2 (two) times daily as needed for anxiety. [provider] Not Taking Unknown time Active   famotidine (PEPCID) 20 MG tablet [id number] No Take 20 mg by mouth 2 (two) times daily. [provider] 09/07/2018 Unknown time Active   furosemide (LASIX) 40 MG tablet [id number] No Take 40 mg by mouth 2 (two) times daily. [provider] 09/06/2018 Unknown time Active Self  gabapentin (NEURONTIN) 300 MG capsule [id number] No Take 300 mg by mouth  at bedtime. [provider] 09/06/2018 Unknown time Active Self  glipiZIDE (GLUCOTROL) 5 MG tablet [id number] No Take 5 mg by mouth daily before breakfast. [provider] 09/06/2018 Unknown time Active Self  loratadine (CLARITIN) 10 MG tablet [id number] No Take 10 mg by mouth daily. [provider] 09/06/2018 Unknown time Active Self  ranitidine (ZANTAC) 150 MG tablet [id number] No Take 300 mg by mouth 2 (two) times daily. [provider] Not Taking Unknown time Active Self             SDOH:  (Social Determinants of Health) assessments and interventions performed:  SDOH Interventions    Flowsheet Row Most Recent Value  SDOH Interventions   Food Insecurity Interventions Intervention Not Indicated  Financial Strain Interventions Intervention Not Indicated  Housing Interventions Intervention Not Indicated  Stress Interventions Intervention Not Indicated  Transportation Interventions Intervention Not Indicated       Care Plan  Review of patient past medical history, allergies, medications, health status, including review of consultants reports, laboratory and other test data, was performed as part of comprehensive evaluation for care management services.   Care Plan : RN Care Manager Plan of Care  Updates made by Barbaraann Faster, RN since 10/02/2021 12:00 AM     Problem: Complex Care Coordination Needs and disease management in patient with Diabetes   Priority: High  Onset Date: 09/18/2021     Long-Range Goal: Establish Plan of Care for Management Complex SDOH Barriers, disease management and Care Coordination Needs in patient with DM   Start Date:  09/18/2021  This Visit's Progress: On track  Priority: High  Note:   Current Barriers:  Knowledge Deficits related to plan of care for management of DMII  Care Coordination needs related to Limited education about DM*  RN CM Clinical Goal(s):  Patient will verbalize understanding of plan for  management of DMII as evidenced by verbalize maintenance of HgA1c   through collaboration with RN Care manager, provider, and care team.   Interventions: Outreaches for care coordination, disease management, resources, home care/education needs Inter-disciplinary care team collaboration (see longitudinal plan of care) Evaluation of current treatment plan related to  self management and patient's adherence to plan as established by provider   Diabetes Interventions:  (Status:  Goal on track:  Yes.) Long Term Goal Assessed patient's understanding of A1c goal: <7% Reviewed medications with patient and discussed importance of medication adherence Review of patient status, including review of consultants reports, relevant laboratory and other test results, and medications completed Lab Results  Component Value Date   HGBA1C 6.4 12/05/2018   Patient Goals/Self-Care Activities: Take all medications as prescribed Attend all scheduled provider appointments Call pharmacy for medication refills 3-7 days in advance of running out of medications Attend church or other social activities Perform all self care activities independently  Perform IADL's (shopping, preparing meals, housekeeping, managing finances) independently Call provider office for new concerns or questions   Follow Up Plan:  The patient has been provided with contact information for the care management team and has been advised to call with any health related questions or concerns.  The care management team will reach out to the patient again over the next 60+ business days.       Plan: The patient has been provided with contact information for the care management team and has been advised to call with any health related questions or concerns.  The care management team will reach out to the patient again over the next 60+ business days.  Ksean Vale L. Lavina Hamman, RN, BSN, [place] Coordinator Office  number [phone] [facility] number [phone] Fax number [phone]

## 2021-11-18 ENCOUNTER — Other Ambulatory Visit: Payer: Self-pay | Admitting: *Deleted

## 2021-11-18 DIAGNOSIS — F319 Bipolar disorder, unspecified: Secondary | ICD-10-CM | POA: Insufficient documentation

## 2021-11-18 DIAGNOSIS — F1321 Sedative, hypnotic or anxiolytic dependence, in remission: Secondary | ICD-10-CM | POA: Insufficient documentation

## 2021-11-18 DIAGNOSIS — I10 Essential (primary) hypertension: Secondary | ICD-10-CM | POA: Insufficient documentation

## 2021-11-18 DIAGNOSIS — F1910 Other psychoactive substance abuse, uncomplicated: Secondary | ICD-10-CM | POA: Insufficient documentation

## 2021-11-18 NOTE — Patient Outreach (Signed)
  Care Coordination   11/18/2021 Name: Bryan Manning. MRN: [id number] DOB: 08-Oct-1950   Care Coordination Outreach Attempts:  An unsuccessful telephone outreach was attempted today to offer the patient information about available care coordination services as a benefit of their health plan.   Follow Up Plan:  Additional outreach attempts will be made to offer the patient care coordination information and services.   Encounter Outcome:  No Answer  Care Coordination Interventions Activated:  No   Care Coordination Interventions:  No, not indicated    SIG Maikel Neisler L. Lavina Hamman, RN, BSN, [place] Coordinator Office number [phone]

## 2021-11-18 NOTE — Patient Outreach (Signed)
  Care Coordination   11/18/2021 Name: Bryan Manning. MRN: [id number] DOB: 1951-03-19   Care Coordination Outreach Attempts:  An unsuccessful telephone outreach was attempted today to offer the patient information about available care coordination services as a benefit of their health plan.    Follow Up Plan:  Additional outreach attempts will be made to offer the patient care coordination information and services.   Encounter Outcome:  No Answer  Care Coordination Interventions Activated:  No   Care Coordination Interventions:  No, not indicated    SIG Baya Lentz L. Lavina Hamman, RN, BSN, [place] Coordinator Office number [phone]

## 2021-11-25 ENCOUNTER — Ambulatory Visit: Payer: Self-pay | Admitting: *Deleted

## 2021-11-25 NOTE — Patient Outreach (Signed)
  Care Coordination   11/25/2021 Name: Bryan Manning. MRN: [id number] DOB: [birth year]   Care Coordination Outreach Attempts:  A third unsuccessful outreach was attempted today to offer the patient with information about available care coordination services as a benefit of their health plan.   Follow Up Plan:  No further outreach attempts will be made at this time. We have been unable to contact the patient to offer or enroll patient in care coordination services  Encounter Outcome:  No Answer  Care Coordination Interventions Activated:  No   Care Coordination Interventions:  No, not indicated    SIG Key Cen L. Lavina Hamman, RN, BSN, [place] Coordinator Office number [phone]

## 2021-12-25 ENCOUNTER — Ambulatory Visit (INDEPENDENT_AMBULATORY_CARE_PROVIDER_SITE_OTHER): Payer: Medicare Other | Admitting: Dermatology

## 2021-12-25 DIAGNOSIS — L578 Other skin changes due to chronic exposure to nonionizing radiation: Secondary | ICD-10-CM

## 2021-12-25 DIAGNOSIS — L570 Actinic keratosis: Secondary | ICD-10-CM | POA: Diagnosis not present

## 2021-12-25 NOTE — Progress Notes (Unsigned)
   Follow-Up Visit   Subjective  Bryan Manning. is a 71 y.o. male who presents for the following: Follow-up (6 months f/u on precancers his scalp). The patient has spots, moles and lesions to be evaluated, some may be new or changing and the patient has concerns that these could be cancer.  The following portions of the chart were reviewed this encounter and updated as appropriate:   Tobacco  Allergies  Meds  Problems  Med Hx  Surg Hx  Fam Hx     Review of Systems:  No other skin or systemic complaints except as noted in HPI or Assessment and Plan.  Objective  Well appearing patient in no apparent distress; mood and affect are within normal limits.  A focused examination was performed including face,scalp. Relevant physical exam findings are noted in the Assessment and Plan.  scalp x 12 (12) Erythematous thin papules/macules with gritty scale.    Assessment & Plan  AK (actinic keratosis) (12) scalp x 12  Actinic keratoses are precancerous spots that appear secondary to cumulative UV radiation exposure/sun exposure over time. They are chronic with expected duration over 1 year. A portion of actinic keratoses will progress to squamous cell carcinoma of the skin. It is not possible to reliably predict which spots will progress to skin cancer and so treatment is recommended to prevent development of skin cancer.  Recommend daily broad spectrum sunscreen SPF 30+ to sun-exposed areas, reapply every 2 hours as needed.  Recommend staying in the shade or wearing long sleeves, sun glasses (UVA+UVB protection) and wide brim hats (4-inch brim around the entire circumference of the hat). Call for new or changing lesions.   Destruction of lesion - scalp x 12 Complexity: simple   Destruction method: cryotherapy   Informed consent: discussed and consent obtained   Timeout:  patient name, date of birth, surgical site, and procedure verified Lesion destroyed using liquid nitrogen: Yes    Region frozen until ice ball extended beyond lesion: Yes   Outcome: patient tolerated procedure well with no complications   Post-procedure details: wound care instructions given    Actinic Damage - chronic, secondary to cumulative UV radiation exposure/sun exposure over time - diffuse scaly erythematous macules with underlying dyspigmentation - Recommend daily broad spectrum sunscreen SPF 30+ to sun-exposed areas, reapply every 2 hours as needed.  - Recommend staying in the shade or wearing long sleeves, sun glasses (UVA+UVB protection) and wide brim hats (4-inch brim around the entire circumference of the hat). - Call for new or changing lesions.   Return in about 6 months (around 06/25/2022) for TBSE, hx of AKs .  IMarye Round, CMA, am acting as scribe for Sarina Ser, MD .  Documentation: I have reviewed the above documentation for accuracy and completeness, and I agree with the above.  Sarina Ser, MD

## 2021-12-25 NOTE — Patient Instructions (Addendum)
Cryotherapy Aftercare  Wash gently with soap and water everyday.   Apply Vaseline and Band-Aid daily until healed.     Due to recent changes in healthcare laws, you may see results of your pathology and/or laboratory studies on MyChart before the doctors have had a chance to review them. We understand that in some cases there may be results that are confusing or concerning to you. Please understand that not all results are received at the same time and often the doctors may need to interpret multiple results in order to provide you with the best plan of care or course of treatment. Therefore, we ask that you please give us 2 business days to thoroughly review all your results before contacting the office for clarification. Should we see a critical lab result, you will be contacted sooner.   If You Need Anything After Your Visit  If you have any questions or concerns for your doctor, please call our main line at [phone] and press option 4 to reach your doctor's medical assistant. If no one answers, please leave a voicemail as directed and we will return your call as soon as possible. Messages left after 4 pm will be answered the following business day.   You may also send us a message via MyChart. We typically respond to MyChart messages within 1-2 business days.  For prescription refills, please ask your pharmacy to contact our office. Our fax number is [phone].  If you have an urgent issue when the clinic is closed that cannot wait until the next business day, you can page your doctor at the number below.    Please note that while we do our best to be available for urgent issues outside of office hours, we are not available 24/7.   If you have an urgent issue and are unable to reach us, you may choose to seek medical care at your doctor's office, retail clinic, urgent care center, or emergency room.  If you have a medical emergency, please immediately call 911 or go to the  emergency department.  Pager Numbers  - Dr. Kowalski: [phone]  - Dr. Moye: [phone]  - Dr. Stewart: [phone]  In the event of inclement weather, please call our main line at [phone] for an update on the status of any delays or closures.  Dermatology Medication Tips: Please keep the boxes that topical medications come in in order to help keep track of the instructions about where and how to use these. Pharmacies typically print the medication instructions only on the boxes and not directly on the medication tubes.   If your medication is too expensive, please contact our office at [phone] option 4 or send us a message through MyChart.   We are unable to tell what your co-pay for medications will be in advance as this is different depending on your insurance coverage. However, we may be able to find a substitute medication at lower cost or fill out paperwork to get insurance to cover a needed medication.   If a prior authorization is required to get your medication covered by your insurance company, please allow us 1-2 business days to complete this process.  Drug prices often vary depending on where the prescription is filled and some pharmacies may offer cheaper prices.  The website www.goodrx.com contains coupons for medications through different pharmacies. The prices here do not account for what the cost may be with help from insurance (it may be cheaper with your insurance), but the website can   give you the price if you did not use any insurance.  - You can print the associated coupon and take it with your prescription to the pharmacy.  - You may also stop by our office during regular business hours and pick up a GoodRx coupon card.  - If you need your prescription sent electronically to a different pharmacy, notify our office through [facility] MyChart or by phone at [phone] option 4.     Si Usted Necesita Algo Despus de Su Visita  Tambin puede  enviarnos un mensaje a travs de MyChart. Por lo general respondemos a los mensajes de MyChart en el transcurso de 1 a 2 das hbiles.  Para renovar recetas, por favor pida a su farmacia que se ponga en contacto con nuestra oficina. Nuestro nmero de fax es el [phone].  Si tiene un asunto urgente cuando la clnica est cerrada y que no puede esperar hasta el siguiente da hbil, puede llamar/localizar a su doctor(a) al nmero que aparece a continuacin.   Por favor, tenga en cuenta que aunque hacemos todo lo posible para estar disponibles para asuntos urgentes fuera del horario de oficina, no estamos disponibles las 24 horas del da, los 7 das de la semana.   Si tiene un problema urgente y no puede comunicarse con nosotros, puede optar por buscar atencin mdica  en el consultorio de su doctor(a), en una clnica privada, en un centro de atencin urgente o en una sala de emergencias.  Si tiene una emergencia mdica, por favor llame inmediatamente al 911 o vaya a la sala de emergencias.  Nmeros de bper  - Dr. Kowalski: [phone]  - Dra. Moye: [phone]  - Dra. Stewart: [phone]  En caso de inclemencias del tiempo, por favor llame a nuestra lnea principal al [phone] para una actualizacin sobre el estado de cualquier retraso o cierre.  Consejos para la medicacin en dermatologa: Por favor, guarde las cajas en las que vienen los medicamentos de uso tpico para ayudarle a seguir las instrucciones sobre dnde y cmo usarlos. Las farmacias generalmente imprimen las instrucciones del medicamento slo en las cajas y no directamente en los tubos del medicamento.   Si su medicamento es muy caro, por favor, pngase en contacto con nuestra oficina llamando al [phone] y presione la opcin 4 o envenos un mensaje a travs de MyChart.   No podemos decirle cul ser su copago por los medicamentos por adelantado ya que esto es diferente dependiendo de la cobertura de su seguro.  Sin embargo, es posible que podamos encontrar un medicamento sustituto a menor costo o llenar un formulario para que el seguro cubra el medicamento que se considera necesario.   Si se requiere una autorizacin previa para que su compaa de seguros cubra su medicamento, por favor permtanos de 1 a 2 das hbiles para completar este proceso.  Los precios de los medicamentos varan con frecuencia dependiendo del lugar de dnde se surte la receta y alguna farmacias pueden ofrecer precios ms baratos.  El sitio web www.goodrx.com tiene cupones para medicamentos de diferentes farmacias. Los precios aqu no tienen en cuenta lo que podra costar con la ayuda del seguro (puede ser ms barato con su seguro), pero el sitio web puede darle el precio si no utiliz ningn seguro.  - Puede imprimir el cupn correspondiente y llevarlo con su receta a la farmacia.  - Tambin puede pasar por nuestra oficina durante el horario de atencin regular y recoger una tarjeta de cupones de GoodRx.  -   Si necesita que su receta se enve electrnicamente a una farmacia diferente, informe a nuestra oficina a travs de MyChart de [place] o por telfono llamando al [phone] y presione la opcin 4.  

## 2021-12-28 ENCOUNTER — Encounter: Payer: Self-pay | Admitting: Dermatology

## 2022-03-18 ENCOUNTER — Ambulatory Visit (INDEPENDENT_AMBULATORY_CARE_PROVIDER_SITE_OTHER): Payer: Medicare Other | Admitting: Dermatology

## 2022-03-18 ENCOUNTER — Encounter: Payer: Self-pay | Admitting: Dermatology

## 2022-03-18 VITALS — BP 153/71 | HR 60

## 2022-03-18 DIAGNOSIS — C44612 Basal cell carcinoma of skin of right upper limb, including shoulder: Secondary | ICD-10-CM | POA: Diagnosis not present

## 2022-03-18 DIAGNOSIS — C4491 Basal cell carcinoma of skin, unspecified: Secondary | ICD-10-CM

## 2022-03-18 DIAGNOSIS — C44619 Basal cell carcinoma of skin of left upper limb, including shoulder: Secondary | ICD-10-CM | POA: Diagnosis not present

## 2022-03-18 DIAGNOSIS — L578 Other skin changes due to chronic exposure to nonionizing radiation: Secondary | ICD-10-CM

## 2022-03-18 DIAGNOSIS — D492 Neoplasm of unspecified behavior of bone, soft tissue, and skin: Secondary | ICD-10-CM

## 2022-03-18 HISTORY — DX: Basal cell carcinoma of skin, unspecified: C44.91

## 2022-03-18 MED ORDER — MUPIROCIN 2 % EX OINT: 1.0000 | TOPICAL_OINTMENT | Freq: Every day | CUTANEOUS | 1 refills | Status: DC

## 2022-03-18 NOTE — Progress Notes (Signed)
Follow-Up Visit   Subjective  Bryan Manning. is a 71 y.o. male who presents for the following: 2 non healing wounds (R and L arm, > 47yr they scab over then open up again, has tried otc cortisone cream). The patient has spots, moles and lesions to be evaluated, some may be new or changing and the patient has concerns that these could be cancer.  The following portions of the chart were reviewed this encounter and updated as appropriate:   Tobacco  Allergies  Meds  Problems  Med Hx  Surg Hx  Fam Hx     Review of Systems:  No other skin or systemic complaints except as noted in HPI or Assessment and Plan.  Objective  Well appearing patient in no apparent distress; mood and affect are within normal limits.  A focused examination was performed including right arm, left arm. Relevant physical exam findings are noted in the Assessment and Plan.  L forearm 2.1 x 1.0cm pink patch with central crust     R forearm 1.2cm pink patch with central crust      Assessment & Plan  Neoplasm of skin (2) L forearm  Skin / nail biopsy Type of biopsy: tangential   Informed consent: discussed and consent obtained   Timeout: patient name, date of birth, surgical site, and procedure verified   Procedure prep:  Patient was prepped and draped in usual sterile fashion Prep type:  Isopropyl alcohol Anesthesia: the lesion was anesthetized in a standard fashion   Anesthetic:  1% lidocaine w/ epinephrine 1-100,000 buffered w/ 8.4% NaHCO3 Instrument used: flexible razor blade   Outcome: patient tolerated procedure well   Post-procedure details: sterile dressing applied and wound care instructions given   Dressing type: bandage and bacitracin    mupirocin ointment (BACTROBAN) 2 % Apply 1 Application topically daily. Qd to wounds on arms  Specimen 1 - Surgical pathology Differential Diagnosis: D48.5 R/O Skin Cancer  Check Margins: No 2.1 x 1.0cm pink patch with central crust  R  forearm  Skin / nail biopsy Type of biopsy: tangential   Informed consent: discussed and consent obtained   Timeout: patient name, date of birth, surgical site, and procedure verified   Procedure prep:  Patient was prepped and draped in usual sterile fashion Prep type:  Isopropyl alcohol Anesthesia: the lesion was anesthetized in a standard fashion   Anesthetic:  1% lidocaine w/ epinephrine 1-100,000 buffered w/ 8.4% NaHCO3 Instrument used: flexible razor blade   Outcome: patient tolerated procedure well   Post-procedure details: sterile dressing applied and wound care instructions given   Dressing type: bandage and bacitracin    Specimen 2 - Surgical pathology Differential Diagnosis: D48.5 R/O Skin Cancer  Check Margins: No 1.2cm pink patch with central crust  Actinic skin damage  Actinic Damage - chronic, secondary to cumulative UV radiation exposure/sun exposure over time - diffuse scaly erythematous macules with underlying dyspigmentation - Recommend daily broad spectrum sunscreen SPF 30+ to sun-exposed areas, reapply every 2 hours as needed.  - Recommend staying in the shade or wearing long sleeves, sun glasses (UVA+UVB protection) and wide brim hats (4-inch brim around the entire circumference of the hat). - Call for new or changing lesions.  Return in about 4 months (around 07/18/2022) for TBSE, Hx of BCC, Hx of AKs.  I, SOthelia Pulling RMA, am acting as scribe for DSarina Ser MD . Documentation: I have reviewed the above documentation for accuracy and completeness, and I agree with the  above.  Sarina Ser, MD

## 2022-03-18 NOTE — Patient Instructions (Addendum)
Wound Care Instructions  Cleanse wound gently with soap and water once a day then pat dry with clean gauze. Apply a thin coat of Petrolatum (petroleum jelly, "Vaseline") over the wound (unless you have an allergy to this). We recommend that you use a new, sterile tube of Vaseline. Do not pick or remove scabs. Do not remove the yellow or white "healing tissue" from the base of the wound.  Cover the wound with fresh, clean, nonstick gauze and secure with paper tape. You may use Band-Aids in place of gauze and tape if the wound is small enough, but would recommend trimming much of the tape off as there is often too much. Sometimes Band-Aids can irritate the skin.  You should call the office for your biopsy report after 1 week if you have not already been contacted.  If you experience any problems, such as abnormal amounts of bleeding, swelling, significant bruising, significant pain, or evidence of infection, please call the office immediately.  FOR ADULT SURGERY PATIENTS: If you need something for pain relief you may take 1 extra strength Tylenol (acetaminophen) AND 2 Ibuprofen (200mg each) together every 4 hours as needed for pain. (do not take these if you are allergic to them or if you have a reason you should not take them.) Typically, you may only need pain medication for 1 to 3 days.     Due to recent changes in healthcare laws, you may see results of your pathology and/or laboratory studies on MyChart before the doctors have had a chance to review them. We understand that in some cases there may be results that are confusing or concerning to you. Please understand that not all results are received at the same time and often the doctors may need to interpret multiple results in order to provide you with the best plan of care or course of treatment. Therefore, we ask that you please give us 2 business days to thoroughly review all your results before contacting the office for clarification. Should  we see a critical lab result, you will be contacted sooner.   If You Need Anything After Your Visit  If you have any questions or concerns for your doctor, please call our main line at [phone] and press option 4 to reach your doctor's medical assistant. If no one answers, please leave a voicemail as directed and we will return your call as soon as possible. Messages left after 4 pm will be answered the following business day.   You may also send us a message via MyChart. We typically respond to MyChart messages within 1-2 business days.  For prescription refills, please ask your pharmacy to contact our office. Our fax number is [phone].  If you have an urgent issue when the clinic is closed that cannot wait until the next business day, you can page your doctor at the number below.    Please note that while we do our best to be available for urgent issues outside of office hours, we are not available 24/7.   If you have an urgent issue and are unable to reach us, you may choose to seek medical care at your doctor's office, retail clinic, urgent care center, or emergency room.  If you have a medical emergency, please immediately call 911 or go to the emergency department.  Pager Numbers  - Dr. Kowalski: [phone]  - Dr. Moye: [phone]  - Dr. Stewart: [phone]  In the event of inclement weather, please call our main line at   [phone] for an update on the status of any delays or closures.  Dermatology Medication Tips: Please keep the boxes that topical medications come in in order to help keep track of the instructions about where and how to use these. Pharmacies typically print the medication instructions only on the boxes and not directly on the medication tubes.   If your medication is too expensive, please contact our office at [phone] option 4 or send us a message through MyChart.   We are unable to tell what your co-pay for medications will be in  advance as this is different depending on your insurance coverage. However, we may be able to find a substitute medication at lower cost or fill out paperwork to get insurance to cover a needed medication.   If a prior authorization is required to get your medication covered by your insurance company, please allow us 1-2 business days to complete this process.  Drug prices often vary depending on where the prescription is filled and some pharmacies may offer cheaper prices.  The website www.goodrx.com contains coupons for medications through different pharmacies. The prices here do not account for what the cost may be with help from insurance (it may be cheaper with your insurance), but the website can give you the price if you did not use any insurance.  - You can print the associated coupon and take it with your prescription to the pharmacy.  - You may also stop by our office during regular business hours and pick up a GoodRx coupon card.  - If you need your prescription sent electronically to a different pharmacy, notify our office through [facility] MyChart or by phone at [phone] option 4.     Si Usted Necesita Algo Despus de Su Visita  Tambin puede enviarnos un mensaje a travs de MyChart. Por lo general respondemos a los mensajes de MyChart en el transcurso de 1 a 2 das hbiles.  Para renovar recetas, por favor pida a su farmacia que se ponga en contacto con nuestra oficina. Nuestro nmero de fax es el [phone].  Si tiene un asunto urgente cuando la clnica est cerrada y que no puede esperar hasta el siguiente da hbil, puede llamar/localizar a su doctor(a) al nmero que aparece a continuacin.   Por favor, tenga en cuenta que aunque hacemos todo lo posible para estar disponibles para asuntos urgentes fuera del horario de oficina, no estamos disponibles las 24 horas del da, los 7 das de la semana.   Si tiene un problema urgente y no puede comunicarse con nosotros, puede  optar por buscar atencin mdica  en el consultorio de su doctor(a), en una clnica privada, en un centro de atencin urgente o en una sala de emergencias.  Si tiene una emergencia mdica, por favor llame inmediatamente al 911 o vaya a la sala de emergencias.  Nmeros de bper  - Dr. Kowalski: [phone]  - Dra. Moye: [phone]  - Dra. Stewart: [phone]  En caso de inclemencias del tiempo, por favor llame a nuestra lnea principal al [phone] para una actualizacin sobre el estado de cualquier retraso o cierre.  Consejos para la medicacin en dermatologa: Por favor, guarde las cajas en las que vienen los medicamentos de uso tpico para ayudarle a seguir las instrucciones sobre dnde y cmo usarlos. Las farmacias generalmente imprimen las instrucciones del medicamento slo en las cajas y no directamente en los tubos del medicamento.   Si su medicamento es muy caro, por favor, pngase en contacto con   nuestra oficina llamando al [phone] y presione la opcin 4 o envenos un mensaje a travs de MyChart.   No podemos decirle cul ser su copago por los medicamentos por adelantado ya que esto es diferente dependiendo de la cobertura de su seguro. Sin embargo, es posible que podamos encontrar un medicamento sustituto a menor costo o llenar un formulario para que el seguro cubra el medicamento que se considera necesario.   Si se requiere una autorizacin previa para que su compaa de seguros cubra su medicamento, por favor permtanos de 1 a 2 das hbiles para completar este proceso.  Los precios de los medicamentos varan con frecuencia dependiendo del lugar de dnde se surte la receta y alguna farmacias pueden ofrecer precios ms baratos.  El sitio web www.goodrx.com tiene cupones para medicamentos de diferentes farmacias. Los precios aqu no tienen en cuenta lo que podra costar con la ayuda del seguro (puede ser ms barato con su seguro), pero el sitio web puede darle el  precio si no utiliz ningn seguro.  - Puede imprimir el cupn correspondiente y llevarlo con su receta a la farmacia.  - Tambin puede pasar por nuestra oficina durante el horario de atencin regular y recoger una tarjeta de cupones de GoodRx.  - Si necesita que su receta se enve electrnicamente a una farmacia diferente, informe a nuestra oficina a travs de MyChart de [facility] o por telfono llamando al [phone] y presione la opcin 4.  

## 2022-03-25 ENCOUNTER — Encounter: Admission: RE | Payer: Self-pay | Source: Home / Self Care

## 2022-03-25 ENCOUNTER — Ambulatory Visit: Admission: RE | Admit: 2022-03-25 | Payer: Medicare Other | Source: Home / Self Care | Admitting: Internal Medicine

## 2022-03-25 SURGERY — COLONOSCOPY WITH PROPOFOL
Anesthesia: General

## 2022-03-29 ENCOUNTER — Encounter: Payer: Self-pay | Admitting: Dermatology

## 2022-03-31 ENCOUNTER — Telehealth: Payer: Self-pay

## 2022-03-31 NOTE — Telephone Encounter (Signed)
Patient informed of pathology results and appointment scheduled.  °

## 2022-03-31 NOTE — Telephone Encounter (Signed)
-----   Message from Ralene Bathe, MD sent at 03/28/2022  2:08 PM EST ----- Diagnosis 1. Skin , left forearm BASAL CELL CARCINOMA, NODULAR AND INFILTRATIVE PATTERNS 2. Skin , right forearm BASAL CELL CARCINOMA, NODULAR AND INFILTRATIVE PATTERNS  1&2 - both Cancer = BCC Schedule for treatment (EDC + topical Chemo)

## 2022-04-23 ENCOUNTER — Ambulatory Visit: Payer: Medicare Other | Admitting: Dermatology

## 2022-04-23 ENCOUNTER — Encounter: Payer: Self-pay | Admitting: Dermatology

## 2022-04-23 VITALS — BP 142/68 | HR 55

## 2022-04-23 DIAGNOSIS — L578 Other skin changes due to chronic exposure to nonionizing radiation: Secondary | ICD-10-CM

## 2022-04-23 DIAGNOSIS — C44619 Basal cell carcinoma of skin of left upper limb, including shoulder: Secondary | ICD-10-CM

## 2022-04-23 DIAGNOSIS — C44612 Basal cell carcinoma of skin of right upper limb, including shoulder: Secondary | ICD-10-CM

## 2022-04-23 NOTE — Progress Notes (Signed)
Follow-Up Visit   Subjective  Bryan Manning. is a 72 y.o. male who presents for the following: BCCS x 2 bx proven (L forearm, R forearm, pt presents for [facility]).  The following portions of the chart were reviewed this encounter and updated as appropriate:   Tobacco  Allergies  Meds  Problems  Med Hx  Surg Hx  Fam Hx     Review of Systems:  No other skin or systemic complaints except as noted in HPI or Assessment and Plan.  Objective  Well appearing patient in no apparent distress; mood and affect are within normal limits.  A focused examination was performed including bil arms. Relevant physical exam findings are noted in the Assessment and Plan.  Left Forearm Pink bx site 2.1  Right Forearm Pink bx site 2.1cm   Assessment & Plan   Actinic Damage - chronic, secondary to cumulative UV radiation exposure/sun exposure over time - diffuse scaly erythematous macules with underlying dyspigmentation - Recommend daily broad spectrum sunscreen SPF 30+ to sun-exposed areas, reapply every 2 hours as needed.  - Recommend staying in the shade or wearing long sleeves, sun glasses (UVA+UVB protection) and wide brim hats (4-inch brim around the entire circumference of the hat). - Call for new or changing lesions.   Basal cell carcinoma (BCC) of skin of left upper extremity including shoulder Left Forearm  Destruction of lesion Complexity: extensive   Destruction method: electrodesiccation and curettage   Informed consent: discussed and consent obtained   Timeout:  patient name, date of birth, surgical site, and procedure verified Procedure prep:  Patient was prepped and draped in usual sterile fashion Prep type:  Isopropyl alcohol Anesthesia: the lesion was anesthetized in a standard fashion   Anesthetic:  1% lidocaine w/ epinephrine 1-100,000 buffered w/ 8.4% NaHCO3 Curettage performed in three different directions: Yes   Electrodesiccation performed over the curetted area:  Yes   Lesion length (cm):  2.1 Lesion width (cm):  2.1 Margin per side (cm):  0.2 Final wound size (cm):  2.5 Hemostasis achieved with:  pressure, aluminum chloride and electrodesiccation Outcome: patient tolerated procedure well with no complications   Post-procedure details: sterile dressing applied and wound care instructions given   Dressing type: bandage and bacitracin    Bx proven  Basal cell carcinoma (BCC) of skin of right upper extremity including shoulder Right Forearm  Destruction of lesion Complexity: extensive   Destruction method: electrodesiccation and curettage   Informed consent: discussed and consent obtained   Timeout:  patient name, date of birth, surgical site, and procedure verified Procedure prep:  Patient was prepped and draped in usual sterile fashion Prep type:  Isopropyl alcohol Anesthesia: the lesion was anesthetized in a standard fashion   Anesthetic:  1% lidocaine w/ epinephrine 1-100,000 buffered w/ 8.4% NaHCO3 Curettage performed in three different directions: Yes   Electrodesiccation performed over the curetted area: Yes   Lesion length (cm):  2.1 Lesion width (cm):  2.1 Margin per side (cm):  0.2 Final wound size (cm):  2.5 Hemostasis achieved with:  suture, pressure, aluminum chloride and electrodesiccation Hemostasis achieved with comment:  2 horizontal mattress and 1 simple interrupted, total of 3 sutures Outcome: patient tolerated procedure well with no complications   Post-procedure details: sterile dressing applied and wound care instructions given   Dressing type: bandage and bacitracin    Bx proven   Return for 1 week s/r and then as scheduled for TBSE, hx  of BCC, AKs.  I,  Othelia Pulling, RMA, am acting as scribe for Sarina Ser, MD . Documentation: I have reviewed the above documentation for accuracy and completeness, and I agree with the above.  Sarina Ser, MD

## 2022-04-23 NOTE — Patient Instructions (Signed)
Wound Care Instructions  Cleanse wound gently with soap and water once a day then pat dry with clean gauze. Apply a thin coat of Petrolatum (petroleum jelly, "Vaseline") over the wound (unless you have an allergy to this). We recommend that you use a new, sterile tube of Vaseline. Do not pick or remove scabs. Do not remove the yellow or white "healing tissue" from the base of the wound.  Cover the wound with fresh, clean, nonstick gauze and secure with paper tape. You may use Band-Aids in place of gauze and tape if the wound is small enough, but would recommend trimming much of the tape off as there is often too much. Sometimes Band-Aids can irritate the skin.  You should call the office for your biopsy report after 1 week if you have not already been contacted.  If you experience any problems, such as abnormal amounts of bleeding, swelling, significant bruising, significant pain, or evidence of infection, please call the office immediately.  FOR ADULT SURGERY PATIENTS: If you need something for pain relief you may take 1 extra strength Tylenol (acetaminophen) AND 2 Ibuprofen (200mg each) together every 4 hours as needed for pain. (do not take these if you are allergic to them or if you have a reason you should not take them.) Typically, you may only need pain medication for 1 to 3 days.     Due to recent changes in healthcare laws, you may see results of your pathology and/or laboratory studies on MyChart before the doctors have had a chance to review them. We understand that in some cases there may be results that are confusing or concerning to you. Please understand that not all results are received at the same time and often the doctors may need to interpret multiple results in order to provide you with the best plan of care or course of treatment. Therefore, we ask that you please give us 2 business days to thoroughly review all your results before contacting the office for clarification. Should  we see a critical lab result, you will be contacted sooner.   If You Need Anything After Your Visit  If you have any questions or concerns for your doctor, please call our main line at [phone] and press option 4 to reach your doctor's medical assistant. If no one answers, please leave a voicemail as directed and we will return your call as soon as possible. Messages left after 4 pm will be answered the following business day.   You may also send us a message via MyChart. We typically respond to MyChart messages within 1-2 business days.  For prescription refills, please ask your pharmacy to contact our office. Our fax number is [phone].  If you have an urgent issue when the clinic is closed that cannot wait until the next business day, you can page your doctor at the number below.    Please note that while we do our best to be available for urgent issues outside of office hours, we are not available 24/7.   If you have an urgent issue and are unable to reach us, you may choose to seek medical care at your doctor's office, retail clinic, urgent care center, or emergency room.  If you have a medical emergency, please immediately call 911 or go to the emergency department.  Pager Numbers  - Dr. Kowalski: [phone]  - Dr. Moye: [phone]  - Dr. Stewart: [phone]  In the event of inclement weather, please call our main line at   [phone] for an update on the status of any delays or closures.  Dermatology Medication Tips: Please keep the boxes that topical medications come in in order to help keep track of the instructions about where and how to use these. Pharmacies typically print the medication instructions only on the boxes and not directly on the medication tubes.   If your medication is too expensive, please contact our office at [phone] option 4 or send us a message through MyChart.   We are unable to tell what your co-pay for medications will be in  advance as this is different depending on your insurance coverage. However, we may be able to find a substitute medication at lower cost or fill out paperwork to get insurance to cover a needed medication.   If a prior authorization is required to get your medication covered by your insurance company, please allow us 1-2 business days to complete this process.  Drug prices often vary depending on where the prescription is filled and some pharmacies may offer cheaper prices.  The website www.goodrx.com contains coupons for medications through different pharmacies. The prices here do not account for what the cost may be with help from insurance (it may be cheaper with your insurance), but the website can give you the price if you did not use any insurance.  - You can print the associated coupon and take it with your prescription to the pharmacy.  - You may also stop by our office during regular business hours and pick up a GoodRx coupon card.  - If you need your prescription sent electronically to a different pharmacy, notify our office through [facility] MyChart or by phone at [phone] option 4.     Si Usted Necesita Algo Despus de Su Visita  Tambin puede enviarnos un mensaje a travs de MyChart. Por lo general respondemos a los mensajes de MyChart en el transcurso de 1 a 2 das hbiles.  Para renovar recetas, por favor pida a su farmacia que se ponga en contacto con nuestra oficina. Nuestro nmero de fax es el [phone].  Si tiene un asunto urgente cuando la clnica est cerrada y que no puede esperar hasta el siguiente da hbil, puede llamar/localizar a su doctor(a) al nmero que aparece a continuacin.   Por favor, tenga en cuenta que aunque hacemos todo lo posible para estar disponibles para asuntos urgentes fuera del horario de oficina, no estamos disponibles las 24 horas del da, los 7 das de la semana.   Si tiene un problema urgente y no puede comunicarse con nosotros, puede  optar por buscar atencin mdica  en el consultorio de su doctor(a), en una clnica privada, en un centro de atencin urgente o en una sala de emergencias.  Si tiene una emergencia mdica, por favor llame inmediatamente al 911 o vaya a la sala de emergencias.  Nmeros de bper  - Dr. Kowalski: [phone]  - Dra. Moye: [phone]  - Dra. Stewart: [phone]  En caso de inclemencias del tiempo, por favor llame a nuestra lnea principal al [phone] para una actualizacin sobre el estado de cualquier retraso o cierre.  Consejos para la medicacin en dermatologa: Por favor, guarde las cajas en las que vienen los medicamentos de uso tpico para ayudarle a seguir las instrucciones sobre dnde y cmo usarlos. Las farmacias generalmente imprimen las instrucciones del medicamento slo en las cajas y no directamente en los tubos del medicamento.   Si su medicamento es muy caro, por favor, pngase en contacto con   nuestra oficina llamando al [phone] y presione la opcin 4 o envenos un mensaje a travs de MyChart.   No podemos decirle cul ser su copago por los medicamentos por adelantado ya que esto es diferente dependiendo de la cobertura de su seguro. Sin embargo, es posible que podamos encontrar un medicamento sustituto a menor costo o llenar un formulario para que el seguro cubra el medicamento que se considera necesario.   Si se requiere una autorizacin previa para que su compaa de seguros cubra su medicamento, por favor permtanos de 1 a 2 das hbiles para completar este proceso.  Los precios de los medicamentos varan con frecuencia dependiendo del lugar de dnde se surte la receta y alguna farmacias pueden ofrecer precios ms baratos.  El sitio web www.goodrx.com tiene cupones para medicamentos de diferentes farmacias. Los precios aqu no tienen en cuenta lo que podra costar con la ayuda del seguro (puede ser ms barato con su seguro), pero el sitio web puede darle el  precio si no utiliz ningn seguro.  - Puede imprimir el cupn correspondiente y llevarlo con su receta a la farmacia.  - Tambin puede pasar por nuestra oficina durante el horario de atencin regular y recoger una tarjeta de cupones de GoodRx.  - Si necesita que su receta se enve electrnicamente a una farmacia diferente, informe a nuestra oficina a travs de MyChart de [place] o por telfono llamando al [phone] y presione la opcin 4.  

## 2022-04-28 ENCOUNTER — Encounter: Payer: Self-pay | Admitting: Dermatology

## 2022-04-30 ENCOUNTER — Ambulatory Visit: Payer: Medicare Other

## 2022-04-30 DIAGNOSIS — Z85828 Personal history of other malignant neoplasm of skin: Secondary | ICD-10-CM

## 2022-04-30 NOTE — Progress Notes (Signed)
   Follow-Up Visit   Subjective  Bryan Manning. is a 72 y.o. male who presents for the following: Follow-up (Suture removal).    The following portions of the chart were reviewed this encounter and updated as appropriate:       Review of Systems:  No other skin or systemic complaints except as noted in HPI or Assessment and Plan.  Objective  Well appearing patient in no apparent distress; mood and affect are within normal limits.  A focused examination was performed including right forearm. Relevant physical exam findings are noted in the Assessment and Plan.  Right forearm Healing EDC site    Assessment & Plan  History of basal cell carcinoma (BCC) Right forearm  Encounter for Removal of Sutures - Incision site at the right forearm is clean, dry and intact - Wound cleansed, sutures removed, wound cleansed and steri strips applied.  - Scars remodel for a full year. - Patient advised to call with any concerns or if they notice any new or changing lesions.    Return for Follow up as scheduled.

## 2022-04-30 NOTE — Patient Instructions (Signed)
Due to recent changes in healthcare laws, you may see results of your pathology and/or laboratory studies on MyChart before the doctors have had a chance to review them. We understand that in some cases there may be results that are confusing or concerning to you. Please understand that not all results are received at the same time and often the doctors may need to interpret multiple results in order to provide you with the best plan of care or course of treatment. Therefore, we ask that you please give us 2 business days to thoroughly review all your results before contacting the office for clarification. Should we see a critical lab result, you will be contacted sooner.   If You Need Anything After Your Visit  If you have any questions or concerns for your doctor, please call our main line at [phone] and press option 4 to reach your doctor's medical assistant. If no one answers, please leave a voicemail as directed and we will return your call as soon as possible. Messages left after 4 pm will be answered the following business day.   You may also send us a message via MyChart. We typically respond to MyChart messages within 1-2 business days.  For prescription refills, please ask your pharmacy to contact our office. Our fax number is [phone].  If you have an urgent issue when the clinic is closed that cannot wait until the next business day, you can page your doctor at the number below.    Please note that while we do our best to be available for urgent issues outside of office hours, we are not available 24/7.   If you have an urgent issue and are unable to reach us, you may choose to seek medical care at your doctor's office, retail clinic, urgent care center, or emergency room.  If you have a medical emergency, please immediately call 911 or go to the emergency department.  Pager Numbers  - Dr. Kowalski: [phone]  - Dr. Moye: [phone]  - Dr. Stewart:  [phone]  In the event of inclement weather, please call our main line at [phone] for an update on the status of any delays or closures.  Dermatology Medication Tips: Please keep the boxes that topical medications come in in order to help keep track of the instructions about where and how to use these. Pharmacies typically print the medication instructions only on the boxes and not directly on the medication tubes.   If your medication is too expensive, please contact our office at [phone] option 4 or send us a message through MyChart.   We are unable to tell what your co-pay for medications will be in advance as this is different depending on your insurance coverage. However, we may be able to find a substitute medication at lower cost or fill out paperwork to get insurance to cover a needed medication.   If a prior authorization is required to get your medication covered by your insurance company, please allow us 1-2 business days to complete this process.  Drug prices often vary depending on where the prescription is filled and some pharmacies may offer cheaper prices.  The website www.goodrx.com contains coupons for medications through different pharmacies. The prices here do not account for what the cost may be with help from insurance (it may be cheaper with your insurance), but the website can give you the price if you did not use any insurance.  - You can print the associated coupon and take it with   your prescription to the pharmacy.  - You may also stop by our office during regular business hours and pick up a GoodRx coupon card.  - If you need your prescription sent electronically to a different pharmacy, notify our office through [facility] MyChart or by phone at [phone] option 4.     Si Usted Necesita Algo Despus de Su Visita  Tambin puede enviarnos un mensaje a travs de MyChart. Por lo general respondemos a los mensajes de MyChart en el transcurso de 1 a 2  das hbiles.  Para renovar recetas, por favor pida a su farmacia que se ponga en contacto con nuestra oficina. Nuestro nmero de fax es el [phone].  Si tiene un asunto urgente cuando la clnica est cerrada y que no puede esperar hasta el siguiente da hbil, puede llamar/localizar a su doctor(a) al nmero que aparece a continuacin.   Por favor, tenga en cuenta que aunque hacemos todo lo posible para estar disponibles para asuntos urgentes fuera del horario de oficina, no estamos disponibles las 24 horas del da, los 7 das de la semana.   Si tiene un problema urgente y no puede comunicarse con nosotros, puede optar por buscar atencin mdica  en el consultorio de su doctor(a), en una clnica privada, en un centro de atencin urgente o en una sala de emergencias.  Si tiene una emergencia mdica, por favor llame inmediatamente al 911 o vaya a la sala de emergencias.  Nmeros de bper  - Dr. Kowalski: [phone]  - Dra. Moye: [phone]  - Dra. Stewart: [phone]  En caso de inclemencias del tiempo, por favor llame a nuestra lnea principal al [phone] para una actualizacin sobre el estado de cualquier retraso o cierre.  Consejos para la medicacin en dermatologa: Por favor, guarde las cajas en las que vienen los medicamentos de uso tpico para ayudarle a seguir las instrucciones sobre dnde y cmo usarlos. Las farmacias generalmente imprimen las instrucciones del medicamento slo en las cajas y no directamente en los tubos del medicamento.   Si su medicamento es muy caro, por favor, pngase en contacto con nuestra oficina llamando al [phone] y presione la opcin 4 o envenos un mensaje a travs de MyChart.   No podemos decirle cul ser su copago por los medicamentos por adelantado ya que esto es diferente dependiendo de la cobertura de su seguro. Sin embargo, es posible que podamos encontrar un medicamento sustituto a menor costo o llenar un formulario para que el  seguro cubra el medicamento que se considera necesario.   Si se requiere una autorizacin previa para que su compaa de seguros cubra su medicamento, por favor permtanos de 1 a 2 das hbiles para completar este proceso.  Los precios de los medicamentos varan con frecuencia dependiendo del lugar de dnde se surte la receta y alguna farmacias pueden ofrecer precios ms baratos.  El sitio web www.goodrx.com tiene cupones para medicamentos de diferentes farmacias. Los precios aqu no tienen en cuenta lo que podra costar con la ayuda del seguro (puede ser ms barato con su seguro), pero el sitio web puede darle el precio si no utiliz ningn seguro.  - Puede imprimir el cupn correspondiente y llevarlo con su receta a la farmacia.  - Tambin puede pasar por nuestra oficina durante el horario de atencin regular y recoger una tarjeta de cupones de GoodRx.  - Si necesita que su receta se enve electrnicamente a una farmacia diferente, informe a nuestra oficina a travs de MyChart de [facility]   o por telfono llamando al [phone] y presione la opcin 4.  

## 2022-06-25 ENCOUNTER — Ambulatory Visit: Payer: Medicare Other | Admitting: Dermatology

## 2022-06-25 VITALS — BP 132/69

## 2022-06-25 DIAGNOSIS — L821 Other seborrheic keratosis: Secondary | ICD-10-CM

## 2022-06-25 DIAGNOSIS — L308 Other specified dermatitis: Secondary | ICD-10-CM | POA: Diagnosis not present

## 2022-06-25 DIAGNOSIS — L578 Other skin changes due to chronic exposure to nonionizing radiation: Secondary | ICD-10-CM | POA: Diagnosis not present

## 2022-06-25 DIAGNOSIS — Z79899 Other long term (current) drug therapy: Secondary | ICD-10-CM

## 2022-06-25 DIAGNOSIS — D229 Melanocytic nevi, unspecified: Secondary | ICD-10-CM

## 2022-06-25 DIAGNOSIS — L814 Other melanin hyperpigmentation: Secondary | ICD-10-CM | POA: Diagnosis not present

## 2022-06-25 DIAGNOSIS — Z1283 Encounter for screening for malignant neoplasm of skin: Secondary | ICD-10-CM | POA: Diagnosis not present

## 2022-06-25 DIAGNOSIS — Z7189 Other specified counseling: Secondary | ICD-10-CM

## 2022-06-25 DIAGNOSIS — Z5111 Encounter for antineoplastic chemotherapy: Secondary | ICD-10-CM

## 2022-06-25 DIAGNOSIS — L570 Actinic keratosis: Secondary | ICD-10-CM

## 2022-06-25 DIAGNOSIS — L2089 Other atopic dermatitis: Secondary | ICD-10-CM

## 2022-06-25 MED ORDER — MOMETASONE FUROATE 0.1 % EX CREA: 1.0000 | TOPICAL_CREAM | Freq: Every day | CUTANEOUS | 2 refills | Status: DC | PRN

## 2022-06-25 NOTE — Progress Notes (Signed)
Follow-Up Visit   Subjective  Bryan Manning. is a 72 y.o. male who presents for the following: Annual Exam (History of BCC - The patient presents for Total-Body Skin Exam (TBSE) for skin cancer screening and mole check.  The patient has spots, moles and lesions to be evaluated, some may be new or changing and the patient has concerns that these could be cancer./).  The following portions of the chart were reviewed this encounter and updated as appropriate:   Tobacco  Allergies  Meds  Problems  Med Hx  Surg Hx  Fam Hx     Review of Systems:  No other skin or systemic complaints except as noted in HPI or Assessment and Plan.  Objective  Well appearing patient in no apparent distress; mood and affect are within normal limits.  A full examination was performed including scalp, head, eyes, ears, nose, lips, neck, chest, axillae, abdomen, back, buttocks, bilateral upper extremities, bilateral lower extremities, hands, feet, fingers, toes, fingernails, and toenails. All findings within normal limits unless otherwise noted below.  Scalp (17) Erythematous thin papules/macules with gritty scale.   Left Lower Leg - Anterior Pink patches   Assessment & Plan   Lentigines - Scattered tan macules - Due to sun exposure - Benign-appearing, observe - Recommend daily broad spectrum sunscreen SPF 30+ to sun-exposed areas, reapply every 2 hours as needed. - Call for any changes  Seborrheic Keratoses - Stuck-on, waxy, tan-brown papules and/or plaques  - Benign-appearing - Discussed benign etiology and prognosis. - Observe - Call for any changes  Melanocytic Nevi - Tan-brown and/or pink-flesh-colored symmetric macules and papules - Benign appearing on exam today - Observation - Call clinic for new or changing moles - Recommend daily use of broad spectrum spf 30+ sunscreen to sun-exposed areas.   Hemangiomas - Red papules - Discussed benign nature - Observe - Call for any  changes  Actinic Damage - Chronic condition, secondary to cumulative UV/sun exposure - diffuse scaly erythematous macules with underlying dyspigmentation - Recommend daily broad spectrum sunscreen SPF 30+ to sun-exposed areas, reapply every 2 hours as needed.  - Staying in the shade or wearing long sleeves, sun glasses (UVA+UVB protection) and wide brim hats (4-inch brim around the entire circumference of the hat) are also recommended for sun protection.  - Call for new or changing lesions.  Skin cancer screening performed today.  AK (actinic keratosis) (17) Scalp  Actinic Damage with PreCancerous Actinic Keratoses Counseling for Topical Chemotherapy Management: Patient exhibits: - Severe, confluent actinic changes with pre-cancerous actinic keratoses that is secondary to cumulative UV radiation exposure over time - Condition that is severe; chronic, not at goal. - diffuse scaly erythematous macules and papules with underlying dyspigmentation - Discussed Prescription "Field Treatment" topical Chemotherapy for Severe, Chronic Confluent Actinic Changes with Pre-Cancerous Actinic Keratoses Field treatment involves treatment of an entire area of skin that has confluent Actinic Changes (Sun/ Ultraviolet light damage) and PreCancerous Actinic Keratoses by method of PhotoDynamic Therapy (PDT) and/or prescription Topical Chemotherapy agents such as 5-fluorouracil, 5-fluorouracil/calcipotriene, and/or imiquimod.  The purpose is to decrease the number of clinically evident and subclinical PreCancerous lesions to prevent progression to development of skin cancer by chemically destroying early precancer changes that may or may not be visible.  It has been shown to reduce the risk of developing skin cancer in the treated area. As a result of treatment, redness, scaling, crusting, and open sores may occur during treatment course. One or more than one of  these methods may be used and may have to be used  several times to control, suppress and eliminate the PreCancerous changes. Discussed treatment course, expected reaction, and possible side effects. - Recommend daily broad spectrum sunscreen SPF 30+ to sun-exposed areas, reapply every 2 hours as needed.  - Staying in the shade or wearing long sleeves, sun glasses (UVA+UVB protection) and wide brim hats (4-inch brim around the entire circumference of the hat) are also recommended. - Call for new or changing lesions.    - Start 5-fluorouracil/calcipotriene cream twice a day for 7 days to affected areas including scalp. Prescription sent to [facility]. Patient advised they will receive an email to purchase the medication online and have it sent to their home. Patient provided with handout reviewing treatment course and side effects and advised to call or message [place] on MyChart with any concerns.   Destruction of lesion - Scalp Complexity: simple   Destruction method: cryotherapy   Informed consent: discussed and consent obtained   Timeout:  patient name, date of birth, surgical site, and procedure verified Lesion destroyed using liquid nitrogen: Yes   Region frozen until ice ball extended beyond lesion: Yes   Outcome: patient tolerated procedure well with no complications   Post-procedure details: wound care instructions given    Other eczema /atopic dermatitis Left Lower Leg - Anterior Chronic and persistent condition with duration or expected duration over one year. Condition is symptomatic / bothersome to patient. Not to goal. Start Mometasone cream qd-bid prn rash  Atopic dermatitis (eczema) is a chronic, relapsing, pruritic condition that can significantly affect quality of life. It is often associated with allergic rhinitis and/or asthma and can require treatment with topical medications, phototherapy, or in severe cases biologic injectable medication (Dupixent; Adbry) or Oral JAK inhibitors.  mometasone (ELOCON)  0.1 % cream - Left Lower Leg - Anterior Apply 1 Application topically daily as needed (Rash).  Return for 6-8 months Ak follow up.  I, Ashok Cordia, CMA, am acting as scribe for Sarina Ser, MD . Documentation: I have reviewed the above documentation for accuracy and completeness, and I agree with the above.  Sarina Ser, MD

## 2022-06-25 NOTE — Patient Instructions (Addendum)
Instructions for [facility]  One or more of your medications was sent to the [facility]. You will receive an email from them and can purchase the medicine through that link. It will then be mailed to your home at the address you confirmed. If for any reason you do not receive an email from them, please check your spam folder. If you still do not find the email, please let us know. [facility] phone number is [phone].    Cryotherapy Aftercare  Wash gently with soap and water everyday.   Apply Vaseline and Band-Aid daily until healed.    Due to recent changes in healthcare laws, you may see results of your pathology and/or laboratory studies on MyChart before the doctors have had a chance to review them. We understand that in some cases there may be results that are confusing or concerning to you. Please understand that not all results are received at the same time and often the doctors may need to interpret multiple results in order to provide you with the best plan of care or course of treatment. Therefore, we ask that you please give us 2 business days to thoroughly review all your results before contacting the office for clarification. Should we see a critical lab result, you will be contacted sooner.   If You Need Anything After Your Visit  If you have any questions or concerns for your doctor, please call our main line at [phone] and press option 4 to reach your doctor's medical assistant. If no one answers, please leave a voicemail as directed and we will return your call as soon as possible. Messages left after 4 pm will be answered the following business day.   You may also send us a message via MyChart. We typically respond to MyChart messages within 1-2 business days.  For prescription refills, please ask your pharmacy to contact our office. Our fax number is [phone].  If you have an urgent issue when the clinic  is closed that cannot wait until the next business day, you can page your doctor at the number below.    Please note that while we do our best to be available for urgent issues outside of office hours, we are not available 24/7.   If you have an urgent issue and are unable to reach us, you may choose to seek medical care at your doctor's office, retail clinic, urgent care center, or emergency room.  If you have a medical emergency, please immediately call 911 or go to the emergency department.  Pager Numbers  - Dr. Kowalski: [phone]  - Dr. Moye: [phone]  - Dr. Stewart: [phone]  In the event of inclement weather, please call our main line at [phone] for an update on the status of any delays or closures.  Dermatology Medication Tips: Please keep the boxes that topical medications come in in order to help keep track of the instructions about where and how to use these. Pharmacies typically print the medication instructions only on the boxes and not directly on the medication tubes.   If your medication is too expensive, please contact our office at [phone] option 4 or send us a message through MyChart.   We are unable to tell what your co-pay for medications will be in advance as this is different depending on your insurance coverage. However, we may be able to find a substitute medication at lower cost or fill out paperwork to get insurance to cover   a needed medication.   If a prior authorization is required to get your medication covered by your insurance company, please allow us 1-2 business days to complete this process.  Drug prices often vary depending on where the prescription is filled and some pharmacies may offer cheaper prices.  The website www.goodrx.com contains coupons for medications through different pharmacies. The prices here do not account for what the cost may be with help from insurance (it may be cheaper with your insurance), but the website  can give you the price if you did not use any insurance.  - You can print the associated coupon and take it with your prescription to the pharmacy.  - You may also stop by our office during regular business hours and pick up a GoodRx coupon card.  - If you need your prescription sent electronically to a different pharmacy, notify our office through [facility] MyChart or by phone at [phone] option 4.     Si Usted Necesita Algo Despus de Su Visita  Tambin puede enviarnos un mensaje a travs de MyChart. Por lo general respondemos a los mensajes de MyChart en el transcurso de 1 a 2 das hbiles.  Para renovar recetas, por favor pida a su farmacia que se ponga en contacto con nuestra oficina. Nuestro nmero de fax es el [phone].  Si tiene un asunto urgente cuando la clnica est cerrada y que no puede esperar hasta el siguiente da hbil, puede llamar/localizar a su doctor(a) al nmero que aparece a continuacin.   Por favor, tenga en cuenta que aunque hacemos todo lo posible para estar disponibles para asuntos urgentes fuera del horario de oficina, no estamos disponibles las 24 horas del da, los 7 das de la semana.   Si tiene un problema urgente y no puede comunicarse con nosotros, puede optar por buscar atencin mdica  en el consultorio de su doctor(a), en una clnica privada, en un centro de atencin urgente o en una sala de emergencias.  Si tiene una emergencia mdica, por favor llame inmediatamente al 911 o vaya a la sala de emergencias.  Nmeros de bper  - Dr. Kowalski: [phone]  - Dra. Moye: [phone]  - Dra. Stewart: [phone]  En caso de inclemencias del tiempo, por favor llame a nuestra lnea principal al [phone] para una actualizacin sobre el estado de cualquier retraso o cierre.  Consejos para la medicacin en dermatologa: Por favor, guarde las cajas en las que vienen los medicamentos de uso tpico para ayudarle a seguir las instrucciones  sobre dnde y cmo usarlos. Las farmacias generalmente imprimen las instrucciones del medicamento slo en las cajas y no directamente en los tubos del medicamento.   Si su medicamento es muy caro, por favor, pngase en contacto con nuestra oficina llamando al [phone] y presione la opcin 4 o envenos un mensaje a travs de MyChart.   No podemos decirle cul ser su copago por los medicamentos por adelantado ya que esto es diferente dependiendo de la cobertura de su seguro. Sin embargo, es posible que podamos encontrar un medicamento sustituto a menor costo o llenar un formulario para que el seguro cubra el medicamento que se considera necesario.   Si se requiere una autorizacin previa para que su compaa de seguros cubra su medicamento, por favor permtanos de 1 a 2 das hbiles para completar este proceso.  Los precios de los medicamentos varan con frecuencia dependiendo del lugar de dnde se surte la receta y alguna farmacias pueden ofrecer precios ms baratos.    El sitio web www.goodrx.com tiene cupones para medicamentos de diferentes farmacias. Los precios aqu no tienen en cuenta lo que podra costar con la ayuda del seguro (puede ser ms barato con su seguro), pero el sitio web puede darle el precio si no utiliz ningn seguro.  - Puede imprimir el cupn correspondiente y llevarlo con su receta a la farmacia.  - Tambin puede pasar por nuestra oficina durante el horario de atencin regular y recoger una tarjeta de cupones de GoodRx.  - Si necesita que su receta se enve electrnicamente a una farmacia diferente, informe a nuestra oficina a travs de MyChart de [facility] o por telfono llamando al [phone] y presione la opcin 4.  

## 2022-06-27 ENCOUNTER — Encounter: Payer: Self-pay | Admitting: Dermatology

## 2022-08-28 ENCOUNTER — Other Ambulatory Visit: Payer: Self-pay | Admitting: *Deleted

## 2022-08-28 DIAGNOSIS — K561 Intussusception: Secondary | ICD-10-CM

## 2022-09-01 ENCOUNTER — Ambulatory Visit (INDEPENDENT_AMBULATORY_CARE_PROVIDER_SITE_OTHER): Payer: Medicare Other | Admitting: General Surgery

## 2022-09-01 ENCOUNTER — Encounter: Payer: Self-pay | Admitting: General Surgery

## 2022-09-01 VITALS — BP 137/71 | HR 72 | Temp 98.1°F | Resp 12 | Ht 74.5 in | Wt 191.0 lb

## 2022-09-01 DIAGNOSIS — K561 Intussusception: Secondary | ICD-10-CM

## 2022-09-01 DIAGNOSIS — Z9884 Bariatric surgery status: Secondary | ICD-10-CM

## 2022-09-01 DIAGNOSIS — R1012 Left upper quadrant pain: Secondary | ICD-10-CM | POA: Diagnosis not present

## 2022-09-01 NOTE — Progress Notes (Unsigned)
[facility] History and Physical  Reason for Referral:*** Referring Physician: ***  Chief Complaint   New Patient (Initial Visit)     Bryan Manning. is a 72 y.o. male.  HPI: ***.  The *** started *** and has had a duration of ***.  It is associated with ***.  The *** is improved with ***, and is made worse with ***.    Quality*** Context***  Past Medical History:  Diagnosis Date   Actinic keratosis    Anemia    Anxiety    Arthritis    Basal cell carcinoma    Back - treated in [place]   Basal cell carcinoma (BCC) 03/18/2022   L forearm - [facility] 04/23/2022   Basal cell carcinoma (BCC) 03/18/2022   R forearm - [facility] 04/23/2022   Bipolar 1 disorder (HCC)    Chronic renal insufficiency    GFR 23   Depression    Diabetes (HCC)    Diabetes mellitus, type II (HCC)    GERD (gastroesophageal reflux disease)    Gout    Hypertension    Sleep apnea    cannot tolerate CPAP    Past Surgical History:  Procedure Laterality Date   BACK SURGERY  2006   COLONOSCOPY WITH PROPOFOL N/A 09/07/2018   Procedure: COLONOSCOPY WITH PROPOFOL;  Surgeon: Toledo, Boykin Nearing, MD;  Location: [facility] ENDOSCOPY;  Service: Gastroenterology;  Laterality: N/A;   ESOPHAGOGASTRODUODENOSCOPY (EGD) WITH PROPOFOL N/A 09/07/2018   Procedure: ESOPHAGOGASTRODUODENOSCOPY (EGD) WITH PROPOFOL;  Surgeon: Toledo, Boykin Nearing, MD;  Location: [facility] ENDOSCOPY;  Service: Gastroenterology;  Laterality: N/A;   GASTRIC BYPASS  2008   INGUINAL HERNIA REPAIR     age 72   REVISION OF TOTAL SHOULDER  2017   soft palate reduction     TOTAL SHOULDER REPLACEMENT Right 2017   TRANSURETHRAL RESECTION OF PROSTATE     X 2    Family History  Problem Relation Age of Onset   Rheum arthritis Mother    Diabetes Mother    Heart disease Father    Colon cancer Neg Hx     Social History   Tobacco Use   Smoking status: Former    Packs/day: 1.00    Years: 32.00    Additional pack years: 0.00    Total pack years:  32.00    Types: Cigarettes    Quit date: 01/12/1997    Years since quitting: 25.6   Smokeless tobacco: Never  Vaping Use   Vaping Use: Never used  Substance Use Topics   Alcohol use: Yes    Comment: occas   Drug use: Never    Medications: {medication reviewed/display:3041432} Allergies as of 09/01/2022       Reactions   Iodinated Contrast Media Hives, Itching   Other Other (See Comments), Nausea And Vomiting   TERAMYCIN-trouble breathing   Oxytetracycline Shortness Of Breath, Rash   Breathing difficulties Breathing difficulties Other reaction(s): Respiratory arrest   Ropinirole    Other reaction(s): Confusion (intolerance), Memory impairment Other reaction(s): Memory impairment   Silicone Hives, Rash   Tape Hives, Rash   Amoxicillin Nausea Only   Tetracycline    Other reaction(s): Other (see comments)   Nsaids Other (See Comments), Nausea And Vomiting, Itching   Hx gastric bypass can not take nsaids Hx gastric bypass can not take nsaids        Medication List        Accurate as of Sep 01, 2022 10:26 AM. If you have any  questions, ask your nurse or doctor.          STOP taking these medications    calcitRIOL 0.5 MCG capsule Commonly known as: ROCALTROL Stopped by: Lucretia Roers, MD   clonazePAM 0.5 MG tablet Commonly known as: KLONOPIN Stopped by: Lucretia Roers, MD   ergocalciferol 1.25 MG (50000 UT) capsule Commonly known as: VITAMIN D2 Stopped by: Lucretia Roers, MD   famotidine 20 MG tablet Commonly known as: PEPCID Stopped by: Lucretia Roers, MD   furosemide 40 MG tablet Commonly known as: LASIX Stopped by: Lucretia Roers, MD   gabapentin 300 MG capsule Commonly known as: NEURONTIN Stopped by: Lucretia Roers, MD   glipiZIDE 5 MG tablet Commonly known as: GLUCOTROL Stopped by: Lucretia Roers, MD   loratadine 10 MG tablet Commonly known as: CLARITIN Stopped by: Lucretia Roers, MD   methylPREDNISolone 4 MG Tbpk  tablet Commonly known as: MEDROL DOSEPAK Stopped by: Lucretia Roers, MD   mometasone 0.1 % cream Commonly known as: ELOCON Stopped by: Lucretia Roers, MD   mupirocin ointment 2 % Commonly known as: BACTROBAN Stopped by: Lucretia Roers, MD   ranitidine 150 MG tablet Commonly known as: ZANTAC Stopped by: Lucretia Roers, MD       TAKE these medications    amLODipine 5 MG tablet Commonly known as: NORVASC Take 5 mg by mouth daily.   ARIPiprazole 20 MG tablet Commonly known as: ABILIFY Take 20 mg by mouth daily. What changed: Another medication with the same name was removed. Continue taking this medication, and follow the directions you see here. Changed by: Lucretia Roers, MD   FLUoxetine 10 MG capsule Commonly known as: PROZAC Take 40 mg by mouth daily.   TAMSULOSIN HCL PO Take by mouth.         ROS:  {Review of Systems:30496}  Blood pressure 137/71, pulse 72, temperature 98.1 F (36.7 C), temperature source Other (Comment), resp. rate 12, height 6' 2.5" (1.892 m), weight 191 lb (86.6 kg), SpO2 97 %. Physical Exam  Results: No results found for this or any previous visit (from the past 48 hour(s)).  No results found.   Assessment & Plan:  [name] is a 72 y.o. male with *** -*** -*** -Follow up ***  All questions were answered to the satisfaction of the patient and family***.  The risk and benefits of *** were discussed including but not limited to ***.  After careful consideration, [name] has decided to ***.    Lucretia Roers 09/01/2022, 10:26 AM

## 2022-09-01 NOTE — Patient Instructions (Addendum)
Will try to see your images for your MRI and CT scan with our radiologist. If I am unable to do this, we will call you to get a CD with the images on it from the Imaging center. Get your MRI and CT scan from 2024.   Will call after seeing imaging and discuss plan and options.

## 2022-09-11 ENCOUNTER — Other Ambulatory Visit (HOSPITAL_COMMUNITY): Payer: Self-pay | Admitting: General Surgery

## 2022-09-11 ENCOUNTER — Inpatient Hospital Stay
Admission: RE | Admit: 2022-09-11 | Discharge: 2022-09-11 | Disposition: A | Discharge status: Home / Self Care | Payer: Self-pay | Source: Ambulatory Visit | Attending: General Surgery | Admitting: General Surgery

## 2022-09-11 ENCOUNTER — Telehealth (INDEPENDENT_AMBULATORY_CARE_PROVIDER_SITE_OTHER): Payer: Medicare Other | Admitting: General Surgery

## 2022-09-11 DIAGNOSIS — R1012 Left upper quadrant pain: Secondary | ICD-10-CM

## 2022-09-11 DIAGNOSIS — Z9884 Bariatric surgery status: Secondary | ICD-10-CM

## 2022-09-11 DIAGNOSIS — K561 Intussusception: Secondary | ICD-10-CM

## 2022-09-11 NOTE — Telephone Encounter (Signed)
[facility]  Reviewed his imaging  with Dr. Milford Cage and reviewed the uploaded MRI and CT from [facility]. Images on EPIC now.   CT from 06/2022 without any obvious pathology. He has a retro-colic roux en y gastric bypass and the area of small bowel concerning on the MRI is located on the alimentary limb proximal to the JJ anastomosis and is not distended and cannot make out the wall on this scan. No obvious hernia, no obvious internal hernia.   MRI 08/2022 reviewed and area of small bowel intussusception noted and is located on the alimentary limb proximal to the JJ anastomosis.   Pain location is close to the location of the intussusception. Discussed with patient and radiology. Discussed repeat imaging. He has CKD and allergy to contrast. He will not be able to get IV contrast due to his GFR but will get po contrast.  I have made notes and told the patient that he will need oral contrast, will need to drink normal contrast, but also drink more contrast just before lying on the table to get imaged as his upper portion of this alimentary limb for his roux y en gastric bypass is the location of the area of interest.   I have ordered a CT and will get the office to get it approved scheduled. If he ends up and needs exploration will get him referred to a bypass surgeon as they may have to revise his bypass given that this is on the alimentary limb.  Bryan Greenhouse, MD [facility] [address] Vella Raring [place], [place] [address] [phone] (office)

## 2022-09-14 ENCOUNTER — Ambulatory Visit (HOSPITAL_BASED_OUTPATIENT_CLINIC_OR_DEPARTMENT_OTHER): Payer: Medicare Other

## 2022-09-14 NOTE — Telephone Encounter (Signed)
Imaging scheduled and patient made aware.   Message sent via MyChart per patient request with imaging information.

## 2022-09-16 ENCOUNTER — Ambulatory Visit (HOSPITAL_BASED_OUTPATIENT_CLINIC_OR_DEPARTMENT_OTHER)
Admission: RE | Admit: 2022-09-16 | Discharge: 2022-09-16 | Disposition: A | Discharge status: Home / Self Care | Payer: Medicare Other | Source: Ambulatory Visit | Attending: General Surgery | Admitting: General Surgery

## 2022-09-16 DIAGNOSIS — Z9884 Bariatric surgery status: Secondary | ICD-10-CM | POA: Insufficient documentation

## 2022-09-16 DIAGNOSIS — R1012 Left upper quadrant pain: Secondary | ICD-10-CM | POA: Insufficient documentation

## 2022-09-16 DIAGNOSIS — K561 Intussusception: Secondary | ICD-10-CM

## 2022-09-17 NOTE — Progress Notes (Signed)
Can you call and let patient know nothing obvious on the CT but the bowel is thickened down to the JJ anastomosis and he appears constipated.  I am going to talk to one of the Bariatric Surgeons at [facility] to see if they can look at his stuff. He may need a diagnostic laparoscopy with them.

## 2022-09-18 ENCOUNTER — Other Ambulatory Visit: Payer: Self-pay | Admitting: Family Medicine

## 2022-09-18 DIAGNOSIS — K561 Intussusception: Secondary | ICD-10-CM

## 2022-09-18 DIAGNOSIS — Z9884 Bariatric surgery status: Secondary | ICD-10-CM

## 2022-09-18 DIAGNOSIS — R1012 Left upper quadrant pain: Secondary | ICD-10-CM

## 2022-09-18 NOTE — Progress Notes (Signed)
Spoke with patient and let him know that I had sent a message to Dr. Andrey Campanile [facility]. Dr. Andrey Campanile is worried he could in fact being having intussusception intermittently at the JJ anastomosis. He thinks he needs a diagnostic laparoscopy, possible pexy of the anastomosis versus revision is what he is thinking. He wants [place] to refer the patient down and the patient is in agreement and was appreciative of the help we have been able to offer. Please refer him down to [facility] to one of the surgeons that does bariatric surgery.

## 2022-09-21 ENCOUNTER — Other Ambulatory Visit: Payer: Self-pay | Admitting: *Deleted

## 2022-09-21 DIAGNOSIS — R1012 Left upper quadrant pain: Secondary | ICD-10-CM

## 2022-09-21 DIAGNOSIS — Z9884 Bariatric surgery status: Secondary | ICD-10-CM

## 2022-09-21 DIAGNOSIS — K561 Intussusception: Secondary | ICD-10-CM

## 2022-10-19 ENCOUNTER — Ambulatory Visit: Payer: Self-pay | Admitting: General Surgery

## 2022-10-19 DIAGNOSIS — Z9884 Bariatric surgery status: Secondary | ICD-10-CM

## 2022-10-19 DIAGNOSIS — N1831 Chronic kidney disease, stage 3a: Secondary | ICD-10-CM

## 2022-10-19 NOTE — Progress Notes (Signed)
Surgery orders requested via Epic inbox. °

## 2022-10-23 NOTE — Progress Notes (Addendum)
COVID Vaccine received:  []  No [x]  Yes Date of any COVID positive Test in last 90 days: No PCP - Erskine Speed FNP Cardiologist - no  Chest x-ray -  EKG -  10/26/22 EPIC Stress Test -  ECHO -  Cardiac Cath -   Bowel Prep - [x]  No  []   Yes ______  Pacemaker / ICD device [x]  No []  Yes   Spinal Cord Stimulator:[x]  No []  Yes       History of Sleep Apnea? [x]  No []  Yes   CPAP used?- [x]  No []  Yes    Does the patient monitor blood sugar?          []  No []  Yes  []  N/A  Patient has: []  NO Hx DM   []  Pre-DM                 []  DM1  [x]   DM2 Does patient have a [employer] or Dexacom? [x]  No []  Yes   Fasting Blood Sugar Ranges-  Checks Blood Sugar __0___ times a day  After losing >100 lbs, pt's DM is diet controlled. No DM meds.  GLP1 agonist / usual dose - No GLP1 instructions:  SGLT-2 inhibitors / usual dose - No SGLT-2 instructions:   Blood Thinner / Instructions:No Aspirin Instructions:No  Comments:   Activity level: Patient is able to climb a flight of stairs without difficulty; [x]  No CP  [x]  No SOB, but would have ___   Patient can perform ADLs without assistance.   Anesthesia review: DM2 diet controlled after>100lb wt. Loss,  CKD3, HTN, Serum Creat. 2.15, L BBB  Patient denies shortness of breath, fever, cough and chest pain at PAT appointment.  Patient verbalized understanding and agreement to the Pre-Surgical Instructions that were given to them at this PAT appointment. Patient was also educated of the need to review these PAT instructions again prior to his/her surgery.I reviewed the appropriate phone numbers to call if they have any and questions or concerns.

## 2022-10-23 NOTE — Patient Instructions (Addendum)
SURGICAL WAITING ROOM VISITATION  Patients having surgery or a procedure may have no more than 2 support people in the waiting area - these visitors may rotate.    Children under the age of 76 must have an adult with them who is not the patient.  Due to an increase in RSV and influenza rates and associated hospitalizations, children ages 68 and under may not visit patients in [facility] hospitals.  If the patient needs to stay at the hospital during part of their recovery, the visitor guidelines for inpatient rooms apply. Pre-op nurse will coordinate an appropriate time for 1 support person to accompany patient in pre-op.  This support person may not rotate.    Please refer to the [facility] website for the visitor guidelines for Inpatients (after your surgery is over and you are in a regular room).       Your procedure is scheduled on: 11/02/22   Report to [facility] Main Entrance    Report to admitting at  5:15 AM   Call this number if you have problems the morning of surgery [phone]   Do not eat food :After Midnight.   After Midnight you may have the following liquids until 4:30 AM DAY OF SURGERY  Water Non-Citrus Juices (without pulp, NO RED-Apple, White grape, White cranberry) Black Coffee (NO MILK/CREAM OR CREAMERS, sugar ok)  Clear Tea (NO MILK/CREAM OR CREAMERS, sugar ok) regular and decaf                             Plain Jell-O (NO RED)                                           Fruit ices (not with fruit pulp, NO RED)                                     Popsicles (NO RED)                                                               Sports drinks like Gatorade (NO RED)                 FOLLOW BOWEL PREP AND ANY ADDITIONAL PRE OP INSTRUCTIONS YOU RECEIVED FROM YOUR SURGEON'S OFFICE!!!     Oral Hygiene is also important to reduce your risk of infection.                                    Remember - BRUSH YOUR TEETH THE MORNING OF SURGERY WITH YOUR  REGULAR TOOTHPASTE   Take these medicines the morning of surgery : Amlodipine, Abilify, Atorvastatin, Prozac, Tamsulosin  DO NOT TAKE ANY ORAL DIABETIC MEDICATIONS DAY OF YOUR SURGERY             You may not have any metal on your body including hair pins, jewelry, and body piercing             Do not  wear make-up, lotions, powders, cologne, or deodorant              Men may shave face and neck.   Do not bring valuables to the hospital. [facility] IS NOT             RESPONSIBLE   FOR VALUABLES.   Contacts, glasses, dentures or bridgework may not be worn into surgery.   Bring small overnight bag day of surgery.   DO NOT BRING YOUR HOME MEDICATIONS TO THE HOSPITAL. PHARMACY WILL DISPENSE MEDICATIONS LISTED ON YOUR MEDICATION LIST TO YOU DURING YOUR ADMISSION IN THE HOSPITAL!    Patients discharged on the day of surgery will not be allowed to drive home.  Someone NEEDS to stay with you for the first 24 hours after anesthesia.   Special Instructions: Bring a copy of your healthcare power of attorney and living will documents the day of surgery if you haven't scanned them before.              Please read over the following fact sheets you were given: IF YOU HAVE QUESTIONS ABOUT YOUR PRE-OP INSTRUCTIONS PLEASE CALL [phone] Rosey Bath   If you received a COVID test during your pre-op visit  it is requested that you wear a mask when out in public, stay away from anyone that may not be feeling well and notify your surgeon if you develop symptoms. If you test positive for Covid or have been in contact with anyone that has tested positive in the last 10 days please notify you surgeon.    [facility] - Preparing for Surgery Before surgery, you can play an important role.  Because skin is not sterile, your skin needs to be as free of germs as possible.  You can reduce the number of germs on your skin by washing with CHG (chlorahexidine gluconate) soap before surgery.  CHG is an antiseptic  cleaner which kills germs and bonds with the skin to continue killing germs even after washing. Please DO NOT use if you have an allergy to CHG or antibacterial soaps.  If your skin becomes reddened/irritated stop using the CHG and inform your nurse when you arrive at Short Stay. Do not shave (including legs and underarms) for at least 48 hours prior to the first CHG shower.  You may shave your face/neck.  Please follow these instructions carefully:  1.  Shower with CHG Soap the night before surgery and the  morning of surgery.  2.  If you choose to wash your hair, wash your hair first as usual with your normal  shampoo.  3.  After you shampoo, rinse your hair and body thoroughly to remove the shampoo.                             4.  Use CHG as you would any other liquid soap.  You can apply chg directly to the skin and wash.  Gently with a scrungie or clean washcloth.  5.  Apply the CHG Soap to your body ONLY FROM THE NECK DOWN.   Do   not use on face/ open                           Wound or open sores. Avoid contact with eyes, ears mouth and   genitals (private parts).  Wash face,  Genitals (private parts) with your normal soap.             6.  Wash thoroughly, paying special attention to the area where your    surgery  will be performed.  7.  Thoroughly rinse your body with warm water from the neck down.  8.  DO NOT shower/wash with your normal soap after using and rinsing off the CHG Soap.                9.  Pat yourself dry with a clean towel.            10.  Wear clean pajamas.            11.  Place clean sheets on your bed the night of your first shower and do not  sleep with pets. Day of Surgery : Do not apply any lotions/deodorants the morning of surgery.  Please wear clean clothes to the hospital/surgery center.  FAILURE TO FOLLOW THESE INSTRUCTIONS MAY RESULT IN THE CANCELLATION OF YOUR SURGERY    _How to Manage Your Diabetes Before and After Surgery  Why  is it important to control my blood sugar before and after surgery? Improving blood sugar levels before and after surgery helps healing and can limit problems. A way of improving blood sugar control is eating a healthy diet by:  Eating less sugar and carbohydrates  Increasing activity/exercise  Talking with your doctor about reaching your blood sugar goals High blood sugars (greater than 180 mg/dL) can raise your risk of infections and slow your recovery, so you will need to focus on controlling your diabetes during the weeks before surgery. Make sure that the doctor who takes care of your diabetes knows about your planned surgery including the date and location.  How do I manage my blood sugar before surgery? Check your blood sugar at least 4 times a day, starting 2 days before surgery, to make sure that the level is not too high or low. Check your blood sugar the morning of your surgery when you wake up and every 2 hours until you get to the Short Stay unit. If your blood sugar is less than 70 mg/dL, you will need to treat for low blood sugar: Do not take insulin. Treat a low blood sugar (less than 70 mg/dL) with  cup of clear juice (cranberry or apple), 4 glucose tablets, OR glucose gel. Recheck blood sugar in 15 minutes after treatment (to make sure it is greater than 70 mg/dL). If your blood sugar is not greater than 70 mg/dL on recheck, call [phone] for further instructions. Report your blood sugar to the short stay nurse when you get to Short Stay.  If you are admitted to the hospital after surgery: Your blood sugar will be checked by the staff and you will probably be given insulin after surgery (instead of oral diabetes medicines) to make sure you have good blood sugar levels. The goal for blood sugar control after surgery is 80-180 mg/dL.   WHAT DO I DO ABOUT MY DIABETES MEDICATION?  Do not take oral diabetes medicines (pills) the morning of surgery.  Patient  Signature:  Date:   Nurse Signature:  Date:

## 2022-10-26 ENCOUNTER — Encounter (HOSPITAL_COMMUNITY)
Admission: RE | Admit: 2022-10-26 | Discharge: 2022-10-26 | Disposition: A | Discharge status: Home / Self Care | Payer: Medicare Other | Source: Ambulatory Visit | Attending: General Surgery | Admitting: General Surgery

## 2022-10-26 ENCOUNTER — Encounter (HOSPITAL_COMMUNITY): Payer: Self-pay

## 2022-10-26 ENCOUNTER — Other Ambulatory Visit: Payer: Self-pay

## 2022-10-26 VITALS — BP 135/65 | HR 64 | Temp 98.3°F | Resp 16 | Ht 74.0 in | Wt 185.0 lb

## 2022-10-26 DIAGNOSIS — N1831 Chronic kidney disease, stage 3a: Secondary | ICD-10-CM | POA: Diagnosis not present

## 2022-10-26 DIAGNOSIS — K561 Intussusception: Secondary | ICD-10-CM | POA: Diagnosis not present

## 2022-10-26 DIAGNOSIS — G4730 Sleep apnea, unspecified: Secondary | ICD-10-CM | POA: Diagnosis not present

## 2022-10-26 DIAGNOSIS — Z87891 Personal history of nicotine dependence: Secondary | ICD-10-CM | POA: Diagnosis not present

## 2022-10-26 DIAGNOSIS — Z9884 Bariatric surgery status: Secondary | ICD-10-CM | POA: Insufficient documentation

## 2022-10-26 DIAGNOSIS — I10 Essential (primary) hypertension: Secondary | ICD-10-CM | POA: Insufficient documentation

## 2022-10-26 DIAGNOSIS — I447 Left bundle-branch block, unspecified: Secondary | ICD-10-CM | POA: Insufficient documentation

## 2022-10-26 DIAGNOSIS — E119 Type 2 diabetes mellitus without complications: Secondary | ICD-10-CM

## 2022-10-26 DIAGNOSIS — E1122 Type 2 diabetes mellitus with diabetic chronic kidney disease: Secondary | ICD-10-CM | POA: Diagnosis not present

## 2022-10-26 DIAGNOSIS — Z01818 Encounter for other preprocedural examination: Secondary | ICD-10-CM | POA: Insufficient documentation

## 2022-10-26 LAB — CBC WITH DIFFERENTIAL/PLATELET
Abs Immature Granulocytes: 0.05 10*3/uL (ref 0.00–0.07)
Basophils Absolute: 0.1 10*3/uL (ref 0.0–0.1)
Basophils Relative: 1 %
Eosinophils Absolute: 0.1 10*3/uL (ref 0.0–0.5)
Eosinophils Relative: 1 %
HCT: 39.2 % (ref 39.0–52.0)
Hemoglobin: 12.5 g/dL — ABNORMAL LOW (ref 13.0–17.0)
Immature Granulocytes: 1 %
Lymphocytes Relative: 8 %
Lymphs Abs: 0.9 10*3/uL (ref 0.7–4.0)
MCH: 28.9 pg (ref 26.0–34.0)
MCHC: 31.9 g/dL (ref 30.0–36.0)
MCV: 90.7 fL (ref 80.0–100.0)
Monocytes Absolute: 0.6 10*3/uL (ref 0.1–1.0)
Monocytes Relative: 6 %
Neutro Abs: 9 10*3/uL — ABNORMAL HIGH (ref 1.7–7.7)
Neutrophils Relative %: 83 %
Platelets: 214 10*3/uL (ref 150–400)
RBC: 4.32 MIL/uL (ref 4.22–5.81)
RDW: 13.3 % (ref 11.5–15.5)
WBC: 10.6 10*3/uL — ABNORMAL HIGH (ref 4.0–10.5)
nRBC: 0 % (ref 0.0–0.2)

## 2022-10-26 LAB — COMPREHENSIVE METABOLIC PANEL
ALT: 10 U/L (ref 0–44)
AST: 12 U/L — ABNORMAL LOW (ref 15–41)
Albumin: 3.8 g/dL (ref 3.5–5.0)
Alkaline Phosphatase: 103 U/L (ref 38–126)
Anion gap: 7 (ref 5–15)
BUN: 29 mg/dL — ABNORMAL HIGH (ref 8–23)
CO2: 21 mmol/L — ABNORMAL LOW (ref 22–32)
Calcium: 9.1 mg/dL (ref 8.9–10.3)
Chloride: 107 mmol/L (ref 98–111)
Creatinine, Ser: 2.15 mg/dL — ABNORMAL HIGH (ref 0.61–1.24)
GFR, Estimated: 33 mL/min — ABNORMAL LOW (ref >60)
Glucose, Bld: 172 mg/dL — ABNORMAL HIGH (ref 70–99)
Potassium: 4 mmol/L (ref 3.5–5.1)
Sodium: 135 mmol/L (ref 135–145)
Total Bilirubin: 0.6 mg/dL (ref 0.3–1.2)
Total Protein: 6.6 g/dL (ref 6.5–8.1)

## 2022-10-26 LAB — GLUCOSE, CAPILLARY: Glucose-Capillary: 185 mg/dL — ABNORMAL HIGH (ref 70–99)

## 2022-10-26 NOTE — Progress Notes (Signed)
Please review pt's  preop CMP results drawn 10/26/22.

## 2022-10-27 LAB — HEMOGLOBIN A1C
Hgb A1c MFr Bld: 6.3 % — ABNORMAL HIGH (ref 4.8–5.6)
Mean Plasma Glucose: 134 mg/dL

## 2022-10-28 NOTE — Progress Notes (Signed)
Anesthesia Chart Review   Case: [id number] Date/Time: 11/02/22 0715   Procedure: DIAGNOSTIC LAPAROSCOPY, LAPAROSCOPIC REVISION OF JEJUNOJEJUNOSTOMY   Anesthesia type: General   Pre-op diagnosis: INTUSSUSCEPTION SMALL INTESTINE, HISTORY OF GASTRIC BYPASS   Location: [facility] ROOM 02 / [facility]   Surgeons: Gaynelle Adu, MD       DISCUSSION:71 y.o. former smoker with h/o HTN, sleep apnea, DM II diet controlled, h/o gastric bypass, CKD stage III, chronic LBBB, h/o gastric bypass, intussusception scheduled for above procedure 11/02/22 with Dr. Gaynelle Adu.   LBBB chronic. On EKG 2019. No previous cardiac workup.  Pt reports he can climb a flight of stairs without sob or chest pain.   Creatinine appears to be stable.   10/26/2022 Creatinine 2.15 02/16/2022 Creatinine 2.04 07/22/2021 Creatinine 2.15  Pt last seen by PCP 09/09/2022. Per OV note DM II controlled, HTN controlled, CKD stable.  VS: BP 135/65   Pulse 64   Temp 36.8 C (Oral)   Resp 16   Ht 6\' 2"  (1.88 m)   Wt 83.9 kg   SpO2 97%   BMI 23.75 kg/m   PROVIDERS: Oneal Grout, FNP is PCP    LABS: Labs reviewed: Acceptable for surgery. (all labs ordered are listed, but only abnormal results are displayed)  Labs Reviewed  HEMOGLOBIN A1C - Abnormal; Notable for the following components:      Result Value   Hgb A1c MFr Bld 6.3 (*)    All other components within normal limits  COMPREHENSIVE METABOLIC PANEL - Abnormal; Notable for the following components:   CO2 21 (*)    Glucose, Bld 172 (*)    BUN 29 (*)    Creatinine, Ser 2.15 (*)    AST 12 (*)    GFR, Estimated 33 (*)    All other components within normal limits  CBC WITH DIFFERENTIAL/PLATELET - Abnormal; Notable for the following components:   WBC 10.6 (*)    Hemoglobin 12.5 (*)    Neutro Abs 9.0 (*)    All other components within normal limits  GLUCOSE, CAPILLARY - Abnormal; Notable for the following components:   Glucose-Capillary 185 (*)    All other components  within normal limits     IMAGES:   EKG:   CV:  Past Medical History:  Diagnosis Date   Actinic keratosis    Anemia    Anxiety    Arthritis    Basal cell carcinoma    Back - treated in [place]   Basal cell carcinoma (BCC) 03/18/2022   L forearm - [facility] 04/23/2022   Basal cell carcinoma (BCC) 03/18/2022   R forearm - [facility] 04/23/2022   Bipolar 1 disorder (HCC)    Chronic renal insufficiency    GFR 23   Depression    Diabetes (HCC)    Diabetes mellitus, type II (HCC)    GERD (gastroesophageal reflux disease)    Gout    Hypertension    Sleep apnea    cannot tolerate CPAP    Past Surgical History:  Procedure Laterality Date   BACK SURGERY  2006   COLONOSCOPY WITH PROPOFOL N/A 09/07/2018   Procedure: COLONOSCOPY WITH PROPOFOL;  Surgeon: Toledo, Boykin Nearing, MD;  Location: [facility] ENDOSCOPY;  Service: Gastroenterology;  Laterality: N/A;   ESOPHAGOGASTRODUODENOSCOPY (EGD) WITH PROPOFOL N/A 09/07/2018   Procedure: ESOPHAGOGASTRODUODENOSCOPY (EGD) WITH PROPOFOL;  Surgeon: Toledo, Boykin Nearing, MD;  Location: [facility] ENDOSCOPY;  Service: Gastroenterology;  Laterality: N/A;   GASTRIC BYPASS  2008   INGUINAL HERNIA REPAIR  age 69   REVISION OF TOTAL SHOULDER  2017   soft palate reduction     TOTAL SHOULDER REPLACEMENT Right 2017   TRANSURETHRAL RESECTION OF PROSTATE     X 2    MEDICATIONS:  amLODipine (NORVASC) 5 MG tablet   ARIPiprazole (ABILIFY) 20 MG tablet   atorvastatin (LIPITOR) 10 MG tablet   FLUoxetine (PROZAC) 40 MG capsule   tamsulosin (FLOMAX) 0.4 MG CAPS capsule   No current facility-administered medications for this encounter.   Jodell Cipro Ward, PA-C [facility]-Surgical Testing [phone]

## 2022-10-31 NOTE — Anesthesia Preprocedure Evaluation (Signed)
Anesthesia Evaluation  Patient identified by MRN, date of birth, ID band Patient awake    Reviewed: Allergy & Precautions, NPO status , Patient's Chart, lab work & pertinent test results  Airway Mallampati: I  TM Distance: >3 FB Neck ROM: Full    Dental  (+) Teeth Intact, Dental Advisory Given   Pulmonary sleep apnea (cannot tolerate CPAP, used it years ago) , former smoker Quit smoking 1998, 32 pack year history    Pulmonary exam normal breath sounds clear to auscultation       Cardiovascular METS: 3 - Mets hypertension (130s/60s), Pt. on medications Normal cardiovascular exam Rhythm:Regular Rate:Normal  Known LBBB on EKG since 2019 with no history of cardiac w/u  Per pt good exercise tolerance    Neuro/Psych  PSYCHIATRIC DISORDERS Anxiety Depression Bipolar Disorder   negative neurological ROS     GI/Hepatic ,GERD  Controlled,,(+)       marijuana useS/p gastric bypass 2008   Endo/Other  diabetes, Well Controlled, Type 2 Hyperthyroidism   Renal/GU Renal InsufficiencyRenal diseaseCr 2.15  negative genitourinary   Musculoskeletal  (+) Arthritis , Osteoarthritis,    Abdominal   Peds  Hematology negative hematology ROS (+) Hb 12.5   Anesthesia Other Findings   Reproductive/Obstetrics negative OB ROS                             Anesthesia Physical Anesthesia Plan  ASA: 3  Anesthesia Plan: General   Post-op Pain Management: Tylenol PO (pre-op)* and Ketamine IV*   Induction: Intravenous  PONV Risk Score and Plan: 3 and Ondansetron, Dexamethasone, Midazolam and Treatment may vary due to age or medical condition  Airway Management Planned: Oral ETT  Additional Equipment: None  Intra-op Plan:   Post-operative Plan: Extubation in OR  Informed Consent: I have reviewed the patients History and Physical, chart, labs and discussed the procedure including the risks, benefits and  alternatives for the proposed anesthesia with the patient or authorized representative who has indicated his/her understanding and acceptance.     Dental advisory given  Plan Discussed with: CRNA  Anesthesia Plan Comments:        Anesthesia Quick Evaluation

## 2022-11-02 ENCOUNTER — Other Ambulatory Visit: Payer: Self-pay

## 2022-11-02 ENCOUNTER — Ambulatory Visit (HOSPITAL_COMMUNITY): Payer: Medicare Other | Admitting: Physician Assistant

## 2022-11-02 ENCOUNTER — Ambulatory Visit (HOSPITAL_COMMUNITY)
Admission: RE | Admit: 2022-11-02 | Discharge: 2022-11-02 | Disposition: A | Discharge status: Home / Self Care | Payer: Medicare Other | Attending: General Surgery | Admitting: General Surgery

## 2022-11-02 ENCOUNTER — Encounter (HOSPITAL_COMMUNITY)
Admission: RE | Disposition: A | Discharge status: Home / Self Care | Payer: Self-pay | Source: Home / Self Care | Attending: General Surgery

## 2022-11-02 ENCOUNTER — Encounter (HOSPITAL_COMMUNITY): Payer: Self-pay | Admitting: General Surgery

## 2022-11-02 ENCOUNTER — Ambulatory Visit (HOSPITAL_BASED_OUTPATIENT_CLINIC_OR_DEPARTMENT_OTHER): Payer: Medicare Other | Admitting: Anesthesiology

## 2022-11-02 DIAGNOSIS — E0590 Thyrotoxicosis, unspecified without thyrotoxic crisis or storm: Secondary | ICD-10-CM | POA: Diagnosis not present

## 2022-11-02 DIAGNOSIS — E1122 Type 2 diabetes mellitus with diabetic chronic kidney disease: Secondary | ICD-10-CM | POA: Insufficient documentation

## 2022-11-02 DIAGNOSIS — N184 Chronic kidney disease, stage 4 (severe): Secondary | ICD-10-CM | POA: Diagnosis not present

## 2022-11-02 DIAGNOSIS — M1990 Unspecified osteoarthritis, unspecified site: Secondary | ICD-10-CM | POA: Diagnosis not present

## 2022-11-02 DIAGNOSIS — F419 Anxiety disorder, unspecified: Secondary | ICD-10-CM | POA: Diagnosis not present

## 2022-11-02 DIAGNOSIS — R1012 Left upper quadrant pain: Secondary | ICD-10-CM | POA: Diagnosis not present

## 2022-11-02 DIAGNOSIS — Z87891 Personal history of nicotine dependence: Secondary | ICD-10-CM | POA: Diagnosis not present

## 2022-11-02 DIAGNOSIS — I129 Hypertensive chronic kidney disease with stage 1 through stage 4 chronic kidney disease, or unspecified chronic kidney disease: Secondary | ICD-10-CM

## 2022-11-02 DIAGNOSIS — K561 Intussusception: Secondary | ICD-10-CM | POA: Diagnosis not present

## 2022-11-02 DIAGNOSIS — Z833 Family history of diabetes mellitus: Secondary | ICD-10-CM | POA: Insufficient documentation

## 2022-11-02 DIAGNOSIS — Z8261 Family history of arthritis: Secondary | ICD-10-CM | POA: Insufficient documentation

## 2022-11-02 DIAGNOSIS — K219 Gastro-esophageal reflux disease without esophagitis: Secondary | ICD-10-CM | POA: Diagnosis not present

## 2022-11-02 DIAGNOSIS — Z7984 Long term (current) use of oral hypoglycemic drugs: Secondary | ICD-10-CM | POA: Insufficient documentation

## 2022-11-02 DIAGNOSIS — G4730 Sleep apnea, unspecified: Secondary | ICD-10-CM | POA: Diagnosis not present

## 2022-11-02 DIAGNOSIS — E119 Type 2 diabetes mellitus without complications: Secondary | ICD-10-CM

## 2022-11-02 DIAGNOSIS — Z8249 Family history of ischemic heart disease and other diseases of the circulatory system: Secondary | ICD-10-CM | POA: Insufficient documentation

## 2022-11-02 DIAGNOSIS — Z79899 Other long term (current) drug therapy: Secondary | ICD-10-CM | POA: Insufficient documentation

## 2022-11-02 DIAGNOSIS — Z9884 Bariatric surgery status: Secondary | ICD-10-CM | POA: Insufficient documentation

## 2022-11-02 DIAGNOSIS — F319 Bipolar disorder, unspecified: Secondary | ICD-10-CM | POA: Insufficient documentation

## 2022-11-02 HISTORY — PX: LAPAROSCOPY: SHX197

## 2022-11-02 LAB — GLUCOSE, CAPILLARY
Glucose-Capillary: 123 mg/dL — ABNORMAL HIGH (ref 70–99)
Glucose-Capillary: 171 mg/dL — ABNORMAL HIGH (ref 70–99)

## 2022-11-02 SURGERY — LAPAROSCOPY, DIAGNOSTIC
Anesthesia: General

## 2022-11-02 MED ORDER — CHLORHEXIDINE GLUCONATE CLOTH 2 % EX PADS: 6.0000 | MEDICATED_PAD | Freq: Once | CUTANEOUS | Status: DC

## 2022-11-02 MED ORDER — KETAMINE HCL 50 MG/5ML IJ SOSY
PREFILLED_SYRINGE | INTRAMUSCULAR | Status: AC
  Filled 2022-11-02: qty 5

## 2022-11-02 MED ORDER — PHENYLEPHRINE 80 MCG/ML (10ML) SYRINGE FOR IV PUSH (FOR BLOOD PRESSURE SUPPORT)
PREFILLED_SYRINGE | INTRAVENOUS | Status: AC
  Filled 2022-11-02: qty 10

## 2022-11-02 MED ORDER — BUPIVACAINE LIPOSOME 1.3 % IJ SUSP: 20.0000 mL | Freq: Once | INTRAMUSCULAR | Status: DC

## 2022-11-02 MED ORDER — DEXAMETHASONE SODIUM PHOSPHATE 4 MG/ML IJ SOLN
INTRAMUSCULAR | Status: DC | PRN
  Administered 2022-11-02: 8 mg via INTRAVENOUS

## 2022-11-02 MED ORDER — BUPIVACAINE LIPOSOME 1.3 % IJ SUSP
INTRAMUSCULAR | Status: DC | PRN
  Administered 2022-11-02: 50 mL

## 2022-11-02 MED ORDER — LIDOCAINE HCL (PF) 2 % IJ SOLN
INTRAMUSCULAR | Status: AC
  Filled 2022-11-02: qty 5

## 2022-11-02 MED ORDER — SUGAMMADEX SODIUM 200 MG/2ML IV SOLN
INTRAVENOUS | Status: DC | PRN
  Administered 2022-11-02: 200 mg via INTRAVENOUS

## 2022-11-02 MED ORDER — LACTATED RINGERS IR SOLN
Status: DC | PRN
  Administered 2022-11-02: 1000 mL

## 2022-11-02 MED ORDER — "VISTASEAL 4 ML SINGLE DOSE KIT "
4.0000 mL | PACK | Freq: Once | CUTANEOUS | Status: DC
  Filled 2022-11-02: qty 4

## 2022-11-02 MED ORDER — BUPIVACAINE HCL (PF) 0.25 % IJ SOLN
INTRAMUSCULAR | Status: AC
  Filled 2022-11-02: qty 30

## 2022-11-02 MED ORDER — FENTANYL CITRATE (PF) 250 MCG/5ML IJ SOLN
INTRAMUSCULAR | Status: AC
  Filled 2022-11-02: qty 5

## 2022-11-02 MED ORDER — LACTATED RINGERS IV SOLN: INTRAVENOUS | Status: DC

## 2022-11-02 MED ORDER — MIDAZOLAM HCL 5 MG/5ML IJ SOLN
INTRAMUSCULAR | Status: DC | PRN
  Administered 2022-11-02 (×2): 1 mg via INTRAVENOUS

## 2022-11-02 MED ORDER — AMISULPRIDE (ANTIEMETIC) 5 MG/2ML IV SOLN: 10.0000 mg | Freq: Once | INTRAVENOUS | Status: DC | PRN

## 2022-11-02 MED ORDER — ONDANSETRON HCL 4 MG/2ML IJ SOLN
INTRAMUSCULAR | Status: AC
  Filled 2022-11-02: qty 2

## 2022-11-02 MED ORDER — ROCURONIUM BROMIDE 10 MG/ML (PF) SYRINGE
PREFILLED_SYRINGE | INTRAVENOUS | Status: AC
  Filled 2022-11-02: qty 10

## 2022-11-02 MED ORDER — ONDANSETRON HCL 4 MG/2ML IJ SOLN: 4.0000 mg | Freq: Once | INTRAMUSCULAR | Status: DC | PRN

## 2022-11-02 MED ORDER — KETAMINE HCL 10 MG/ML IJ SOLN
INTRAMUSCULAR | Status: DC | PRN
  Administered 2022-11-02: 30 mg via INTRAVENOUS

## 2022-11-02 MED ORDER — OXYCODONE HCL 5 MG PO TABS: 5.0000 mg | ORAL_TABLET | Freq: Once | ORAL | Status: AC | PRN

## 2022-11-02 MED ORDER — METOPROLOL TARTRATE 5 MG/5ML IV SOLN
INTRAVENOUS | Status: DC | PRN
  Administered 2022-11-02 (×2): 1 mg via INTRAVENOUS

## 2022-11-02 MED ORDER — DEXAMETHASONE SODIUM PHOSPHATE 10 MG/ML IJ SOLN
INTRAMUSCULAR | Status: AC
  Filled 2022-11-02: qty 1

## 2022-11-02 MED ORDER — INSULIN ASPART 100 UNIT/ML IJ SOLN: 0.0000 [IU] | INTRAMUSCULAR | Status: DC | PRN

## 2022-11-02 MED ORDER — METOPROLOL TARTRATE 5 MG/5ML IV SOLN
INTRAVENOUS | Status: AC
  Filled 2022-11-02: qty 5

## 2022-11-02 MED ORDER — HYDROMORPHONE HCL 1 MG/ML IJ SOLN
0.2500 mg | INTRAMUSCULAR | Status: DC | PRN
  Administered 2022-11-02: 0.25 mg via INTRAVENOUS
  Administered 2022-11-02: 0.5 mg via INTRAVENOUS

## 2022-11-02 MED ORDER — ORAL CARE MOUTH RINSE: 15.0000 mL | Freq: Once | OROMUCOSAL | Status: AC

## 2022-11-02 MED ORDER — OXYCODONE HCL 5 MG PO TABS
ORAL_TABLET | ORAL | Status: AC
  Administered 2022-11-02: 5 mg via ORAL
  Filled 2022-11-02: qty 1

## 2022-11-02 MED ORDER — MIDAZOLAM HCL 2 MG/2ML IJ SOLN
INTRAMUSCULAR | Status: AC
  Filled 2022-11-02: qty 2

## 2022-11-02 MED ORDER — SODIUM CHLORIDE 0.9 % IV SOLN
2.0000 g | INTRAVENOUS | Status: AC
  Administered 2022-11-02: 2 g via INTRAVENOUS
  Filled 2022-11-02: qty 2

## 2022-11-02 MED ORDER — LACTATED RINGERS IV SOLN: INTRAVENOUS | Status: DC | PRN

## 2022-11-02 MED ORDER — ACETAMINOPHEN 500 MG PO TABS: 1000.0000 mg | ORAL_TABLET | Freq: Three times a day (TID) | ORAL | Status: AC

## 2022-11-02 MED ORDER — ONDANSETRON HCL 4 MG/2ML IJ SOLN
INTRAMUSCULAR | Status: DC | PRN
  Administered 2022-11-02: 4 mg via INTRAVENOUS

## 2022-11-02 MED ORDER — FENTANYL CITRATE (PF) 100 MCG/2ML IJ SOLN
INTRAMUSCULAR | Status: DC | PRN
  Administered 2022-11-02: 100 ug via INTRAVENOUS
  Administered 2022-11-02: 50 ug via INTRAVENOUS
  Administered 2022-11-02: 100 ug via INTRAVENOUS

## 2022-11-02 MED ORDER — PROPOFOL 10 MG/ML IV BOLUS
INTRAVENOUS | Status: AC
  Filled 2022-11-02: qty 20

## 2022-11-02 MED ORDER — FIBRIN SEALANT 2 ML SINGLE DOSE KIT
2.0000 mL | PACK | Freq: Once | CUTANEOUS | Status: DC
  Filled 2022-11-02: qty 2

## 2022-11-02 MED ORDER — OXYCODONE HCL 5 MG/5ML PO SOLN: 5.0000 mg | Freq: Once | ORAL | Status: AC | PRN

## 2022-11-02 MED ORDER — CHLORHEXIDINE GLUCONATE 0.12 % MT SOLN
15.0000 mL | Freq: Once | OROMUCOSAL | Status: AC
  Administered 2022-11-02: 15 mL via OROMUCOSAL

## 2022-11-02 MED ORDER — KETAMINE HCL 10 MG/ML IJ SOLN: INTRAMUSCULAR | Status: DC | PRN

## 2022-11-02 MED ORDER — LIDOCAINE HCL (CARDIAC) PF 100 MG/5ML IV SOSY
PREFILLED_SYRINGE | INTRAVENOUS | Status: DC | PRN
  Administered 2022-11-02: 80 mg via INTRAVENOUS

## 2022-11-02 MED ORDER — PROPOFOL 10 MG/ML IV BOLUS
INTRAVENOUS | Status: DC | PRN
Start: 2022-11-02 — End: 2022-11-02
  Administered 2022-11-02: 150 mg via INTRAVENOUS
  Administered 2022-11-02: 50 mg via INTRAVENOUS

## 2022-11-02 MED ORDER — ROCURONIUM BROMIDE 100 MG/10ML IV SOLN
INTRAVENOUS | Status: DC | PRN
  Administered 2022-11-02: 70 mg via INTRAVENOUS

## 2022-11-02 MED ORDER — ACETAMINOPHEN 500 MG PO TABS
1000.0000 mg | ORAL_TABLET | ORAL | Status: AC
  Administered 2022-11-02: 1000 mg via ORAL
  Filled 2022-11-02: qty 2

## 2022-11-02 MED ORDER — HYDROMORPHONE HCL 1 MG/ML IJ SOLN
INTRAMUSCULAR | Status: AC
  Administered 2022-11-02: 0.25 mg via INTRAVENOUS
  Filled 2022-11-02: qty 1

## 2022-11-02 MED ORDER — SODIUM CHLORIDE 0.9 % IV SOLN: INTRAVENOUS | Status: DC

## 2022-11-02 MED ORDER — TRAMADOL HCL 50 MG PO TABS: 50.0000 mg | ORAL_TABLET | Freq: Four times a day (QID) | ORAL | 0 refills | Status: AC | PRN

## 2022-11-02 SURGICAL SUPPLY — 82 items
ANTIFOG SOL W/FOAM PAD STRL (MISCELLANEOUS) ×1
APL LAPSCP 35 DL APL RGD (MISCELLANEOUS) ×2
APL PRP STRL LF DISP 70% ISPRP (MISCELLANEOUS) ×2
APL SWBSTK 6 STRL LF DISP (MISCELLANEOUS)
APPLICATOR COTTON TIP 6 STRL (MISCELLANEOUS) IMPLANT
APPLICATOR COTTON TIP 6IN STRL (MISCELLANEOUS)
APPLICATOR VISTASEAL 35 (MISCELLANEOUS) ×2 IMPLANT
APPLIER CLIP ROT 13.4 12 LRG (CLIP)
APR CLP LRG 13.4X12 ROT 20 MLT (CLIP)
BAG COUNTER SPONGE SURGICOUNT (BAG) IMPLANT
BAG SPNG CNTER NS LX DISP (BAG)
BLADE SURG SZ11 CARB STEEL (BLADE) ×1 IMPLANT
CABLE HIGH FREQUENCY MONO STRZ (ELECTRODE) IMPLANT
CHLORAPREP W/TINT 26 (MISCELLANEOUS) ×2 IMPLANT
CLIP APPLIE ROT 13.4 12 LRG (CLIP) IMPLANT
CLIP SUT LAPRA TY ABSORB (SUTURE) ×2 IMPLANT
CUTTER FLEX LINEAR 45M (STAPLE) IMPLANT
DEVICE SUT QUICK LOAD TK 5 (SUTURE) IMPLANT
DEVICE SUT TI-KNOT TK 5X26 (SUTURE) IMPLANT
DEVICE SUTURE ENDOST 10MM (ENDOMECHANICALS) ×1 IMPLANT
DRAIN PENROSE 0.25X18 (DRAIN) ×1 IMPLANT
DRSG TEGADERM 2-3/8X2-3/4 SM (GAUZE/BANDAGES/DRESSINGS) ×6 IMPLANT
ELECT REM PT RETURN 15FT ADLT (MISCELLANEOUS) ×1 IMPLANT
GAUZE 4X4 16PLY ~~LOC~~+RFID DBL (SPONGE) ×1 IMPLANT
GAUZE SPONGE 2X2 8PLY STRL LF (GAUZE/BANDAGES/DRESSINGS) ×1 IMPLANT
GAUZE SPONGE 4X4 12PLY STRL (GAUZE/BANDAGES/DRESSINGS) IMPLANT
GLOVE BIO SURGEON STRL SZ7.5 (GLOVE) ×1 IMPLANT
GLOVE INDICATOR 8.0 STRL GRN (GLOVE) ×1 IMPLANT
GOWN STRL REUS W/ TWL XL LVL3 (GOWN DISPOSABLE) ×4 IMPLANT
GOWN STRL REUS W/TWL XL LVL3 (GOWN DISPOSABLE) ×4
IRRIG SUCT STRYKERFLOW 2 WTIP (MISCELLANEOUS) ×1
IRRIGATION SUCT STRKRFLW 2 WTP (MISCELLANEOUS) ×1 IMPLANT
KIT BASIN OR (CUSTOM PROCEDURE TRAY) ×1 IMPLANT
KIT GASTRIC LAVAGE 34FR ADT (SET/KITS/TRAYS/PACK) ×1 IMPLANT
KIT TURNOVER KIT A (KITS) IMPLANT
MARKER SKIN DUAL TIP RULER LAB (MISCELLANEOUS) ×1 IMPLANT
MAT PREVALON FULL STRYKER (MISCELLANEOUS) ×1 IMPLANT
NDL SPNL 22GX3.5 QUINCKE BK (NEEDLE) ×1 IMPLANT
NEEDLE SPNL 22GX3.5 QUINCKE BK (NEEDLE) ×1 IMPLANT
PACK CARDIOVASCULAR III (CUSTOM PROCEDURE TRAY) ×1 IMPLANT
RELOAD 45 VASCULAR/THIN (ENDOMECHANICALS) IMPLANT
RELOAD ENDO STITCH 2.0 (ENDOMECHANICALS) ×9
RELOAD STAPLE 45 2.5 WHT GRN (ENDOMECHANICALS) IMPLANT
RELOAD STAPLE 45 3.5 BLU ETS (ENDOMECHANICALS) IMPLANT
RELOAD STAPLE 60 2.6 WHT THN (STAPLE) ×2 IMPLANT
RELOAD STAPLE 60 3.6 BLU REG (STAPLE) ×2 IMPLANT
RELOAD STAPLE 60 3.8 GOLD REG (STAPLE) ×1 IMPLANT
RELOAD STAPLE TA45 3.5 REG BLU (ENDOMECHANICALS) IMPLANT
RELOAD STAPLER BLUE 60MM (STAPLE) ×2 IMPLANT
RELOAD STAPLER GOLD 60MM (STAPLE) ×1 IMPLANT
RELOAD STAPLER WHITE 60MM (STAPLE) ×2 IMPLANT
RELOAD SUT SNGL STCH ABSRB 2-0 (ENDOMECHANICALS) ×5 IMPLANT
RELOAD SUT SNGL STCH BLK 2-0 (ENDOMECHANICALS) ×4 IMPLANT
SCISSORS LAP 5X45 EPIX DISP (ENDOMECHANICALS) ×1 IMPLANT
SET TUBE SMOKE EVAC HIGH FLOW (TUBING) ×1 IMPLANT
SHEARS HARMONIC ACE PLUS 45CM (MISCELLANEOUS) ×1 IMPLANT
SLEEVE ADV FIXATION 12X100MM (TROCAR) ×2 IMPLANT
SLEEVE ADV FIXATION 5X100MM (TROCAR) IMPLANT
SOLUTION ANTFG W/FOAM PAD STRL (MISCELLANEOUS) ×1 IMPLANT
STAPLER ECHELON BIOABSB 60 FLE (MISCELLANEOUS) IMPLANT
STAPLER ECHELON LONG 60 440 (INSTRUMENTS) ×1 IMPLANT
STAPLER RELOAD BLUE 60MM (STAPLE) ×2
STAPLER RELOAD GOLD 60MM (STAPLE) ×1
STAPLER RELOAD WHITE 60MM (STAPLE) ×2
STRIP CLOSURE SKIN 1/2X4 (GAUZE/BANDAGES/DRESSINGS) ×1 IMPLANT
SURGILUBE 2OZ TUBE FLIPTOP (MISCELLANEOUS) ×1 IMPLANT
SUT MNCRL AB 4-0 PS2 18 (SUTURE) ×1 IMPLANT
SUT RELOAD ENDO STITCH 2 48X1 (ENDOMECHANICALS) ×5
SUT RELOAD ENDO STITCH 2.0 (ENDOMECHANICALS) ×4
SUT SURGIDAC NAB ES-9 0 48 120 (SUTURE) IMPLANT
SUT VIC AB 2-0 SH 27 (SUTURE) ×1
SUT VIC AB 2-0 SH 27X BRD (SUTURE) ×1 IMPLANT
SUTURE RELOAD END STTCH 2 48X1 (ENDOMECHANICALS) ×5 IMPLANT
SUTURE RELOAD ENDO STITCH 2.0 (ENDOMECHANICALS) ×4 IMPLANT
SYR 20ML LL LF (SYRINGE) ×2 IMPLANT
TOWEL OR 17X26 10 PK STRL BLUE (TOWEL DISPOSABLE) ×1 IMPLANT
TOWEL OR NON WOVEN STRL DISP B (DISPOSABLE) ×1 IMPLANT
TRAY FOLEY MTR SLVR 16FR STAT (SET/KITS/TRAYS/PACK) IMPLANT
TROCAR ADV FIXATION 12X100MM (TROCAR) ×1 IMPLANT
TROCAR ADV FIXATION 5X100MM (TROCAR) ×1 IMPLANT
TROCAR XCEL NON-BLD 5MMX100MML (ENDOMECHANICALS) ×1 IMPLANT
TUBING CONNECTING 10 (TUBING) ×2 IMPLANT

## 2022-11-02 NOTE — Interval H&P Note (Signed)
History and Physical Interval Note:  11/02/2022 7:31 AM  Bryan Manning.  has presented today for surgery, with the diagnosis of INTUSSUSCEPTION SMALL INTESTINE, HISTORY OF GASTRIC BYPASS.  The various methods of treatment have been discussed with the patient and family. After consideration of risks, benefits and other options for treatment, the patient has consented to  Procedure(s): DIAGNOSTIC LAPAROSCOPY, LAPAROSCOPIC REVISION OF JEJUNOJEJUNOSTOMY (N/A) as a surgical intervention.  The patient's history has been reviewed, patient examined, no change in status, stable for surgery.  I have reviewed the patient's chart and labs.  Questions were answered to the patient's satisfaction.     Gaynelle Adu

## 2022-11-02 NOTE — Transfer of Care (Signed)
Immediate Anesthesia Transfer of Care Note  Patient: Bryan Manning.  Procedure(s) Performed: DIAGNOSTIC LAPAROSCOPY  Patient Location: PACU  Anesthesia Type:General  Level of Consciousness: awake, alert , and oriented  Airway & Oxygen Therapy: Patient Spontanous Breathing and Patient connected to face mask oxygen  Post-op Assessment: Report given to RN and Post -op Vital signs reviewed and stable  Post vital signs: Reviewed and stable  Last Vitals:  Vitals Value Taken Time  BP 171/76 11/02/22 0921  Temp    Pulse 105 11/02/22 0923  Resp 23 11/02/22 0923  SpO2 92 % 11/02/22 0923  Vitals shown include unfiled device data.  Last Pain:  Vitals:   11/02/22 0542  TempSrc: Oral         Complications: No notable events documented.

## 2022-11-02 NOTE — H&P (Signed)
REFERRING PHYSICIAN: Lucretia Roers, MD  PROVIDER: Ramal Eckhardt Sherril Cong, MD  MRN: [id number] DOB: 04-12-1951 DATE OF ENCOUNTER: 10/08/2022  Subjective  Chief Complaint: New Consultation (WEIGHT LOSS)   History of Present Illness: Bryan Manning. is a 72 y.o. male who is seen today as an office consultation at the request of Dr. Henreitta Leber for evaluation of New Consultation (WEIGHT LOSS) .  On December 29, 2007 the patient underwent laparoscopic reversal of fundoplication with hiatal hernia repair along with laparoscopic retrocolic retrogastric Roux-en-Y gastric bypass by Dr. Zane Herald. At the time his BMI was 38 with comorbidities of diabetes mellitus, hypertension, weightbearing arthroplasty along with a recurrent sliding hiatal hernia and GERD. I reviewed the operative note. He had a 100 cm alimentary limb in the retrocolic retrogastric position. The gastrojejunostomy was performed with a 21 EEA stapler. It was about an hour adhesiolysis taking down the old fundoplication and mobilizing the stomach. They repaired the crura with what sounds like 1 suture. Small bowel was divided approximately 60 cm from the ligament of Treitz. They describe closing the jejunostomy mesenteric defect. His Petersons defect and transverse mesenteric defects were closed with 2-0 Surgidac  He reports at least 1 year history of mid abdominal pain mainly to the left of the umbilicus. Is generally always there. Most the time its mild but occasionally can be sharp. It last 1 minute to 30 minutes. It generally occurs every day. He discussed it with his PCP who sent him for an outpatient CT scan which was unremarkable. Because of his kidney function and inability to get IV contrast and because of the persistent symptoms this prompted an MRI which showed an intussusception of the small bowel near his jejunojejunostomy. He was then sent to general surgery in [place] who discussed the case with [place] and we recommended coming to  see our bariatric team. He denies any fevers or chills. Has a bowel movement at least every other day. Not taking a multivitamin.'s medicine for blood pressure and cholesterol and anxiety. Diet controlled diabetes mellitus. He had a remote history of Nissen fundoplication in 1997 prior to undergoing bariatric surgery.   Review of Systems: A complete review of systems was obtained from the patient. I have reviewed this information and discussed as appropriate with the patient. See HPI as well for other ROS.  ROS  Medical History: Past Medical History: Diagnosis Date Anemia August 2016 Anxiety Arthritis Bipolar 1 disorder (CMS/HHS-HCC) Depression Depression with anxiety Diabetes mellitus type 2, uncomplicated (CMS/HHS-HCC) GERD (gastroesophageal reflux disease) Gout, joint 2019 Hypertension denies CP or SOB, no cardiac stent Kidney disease CKD stage IV Osteoarthritis Jan 1996 Sleep apnea cpap full mask; will bring DOS  Patient Active Problem List Diagnosis Chronic right shoulder pain Arthritis of right shoulder region S/p total shoulder arthroplasty, right Pain due to shoulder joint prosthesis (CMS-HCC) S/P right shoulder arthroscopic synovial biopsy Nausea & vomiting History of Roux-en-Y gastric bypass Weight loss, abnormal Hx of adenomatous colonic polyps Change in bowel habits History of iron deficiency anemia Diabetes mellitus type 2, noninsulin dependent (CMS/HHS-HCC) Epigastric abdominal tenderness Other constipation Chronic kidney disease, stage 3 (CMS/HHS-HCC) Essential hypertension Secondary hyperparathyroidism of renal origin (CMS/HHS-HCC)  Past Surgical History: Procedure Laterality Date HERNIA REPAIR Right 1962 inguinal OTHER SURGERY 1997 partial soft palate removed, sleep apnea ABDOMINAL SURGERY 1997 lap. [employer] Fundiplication BACK SURGERY 2006 laminectomy, lower back ARTHROSCOPY SHOULDER Right 2007 frozen shoulder LAPAROSCOPIC GASTRIC BYPASS  2009 TURP VAPORIZATION 2012 x2 ARTHROSCOPY SHOULDER Right 2016 arthritis ARTHROPLASTY TOTAL SHOULDER Right  06/10/2015 Procedure: RIGHT TOTAL SHOULDER REPLACEMENT; Surgeon: Evalee Jefferson, MD; Location: [facility] OR; Service: Orthopedics; Laterality: Right; ARTHROPLASTY TOTAL SHOULDER Right 07/01/2015 Procedure: Revision Right Total Shoulder Replacement; Surgeon: Evalee Jefferson, MD; Location[facility] OR; Service: Orthopedics; Laterality: Right; JOINT REPLACEMENT 07/01/2015 ARTHROSCOPY SHOULDER Right 01/30/2016 Procedure: Right shoulder arthroscopy with synovial biopsy; Surgeon: Evalee Jefferson, MD; Location: [facility] OR; Service: Orthopedics; Laterality: Right; COLONOSCOPY 09/07/2018 Tubular adenoma /Repeat 75yrs/TKT (10/15/2021 Recall letter returned.awb) EGD 09/07/2018 Gastritis/No Repeat/TKT Colon @ [facility] 05/05/2022 Tubular adenomas/Sessile serrated adenoma/PHx CP/Repeat 50yrs/TKT SEPTOPLASTY 5638,7564   Allergies Allergen Reactions Adhesive Tape-Silicones Hives and Rash Iodinated Contrast Media Hives and Itching Terramycin [Oxytetracycline Hcl] Shortness Of Breath Breathing difficulties Dye Hives Nsaids (Non-Steroidal Anti-Inflammatory Drug) Other (See Comments) Hx gastric bypass can not take nsaids Other Nausea And Vomiting  Current Outpatient Medications on File Prior to Visit Medication Sig Dispense Refill amLODIPine (NORVASC) 5 MG tablet ARIPiprazole (ABILIFY) 20 MG tablet Take by mouth atorvastatin (LIPITOR) 10 MG tablet Take by mouth FLUoxetine (PROZAC) 40 MG capsule Take 40 mg by mouth once daily tamsulosin (FLOMAX) 0.4 mg capsule Take 1 capsule by mouth at bedtime ARIPiprazole (ABILIFY) 2 MG tablet Take 2 mg by mouth once daily (Patient not taking: Reported on 10/08/2022) calcitRIOL (ROCALTROL) 0.5 MCG capsule Take 0.5 mcg by mouth once daily. (Patient not taking: Reported on 10/08/2022) cyanocobalamin (VITAMIN B12) 1000 MCG tablet Take 1,000 mcg by mouth once  daily (Patient not taking: Reported on 10/08/2022) famotidine (PEPCID) 40 MG tablet TAKE 1 TABLET DAILY (NEED OFFICE VISIT FOR FURTHER REFILLS) 90 tablet 0 FUROsemide (LASIX) 20 MG tablet Take 20 mg by mouth every morning. (Patient not taking: Reported on 10/08/2022) glipiZIDE (GLUCOTROL) 5 MG tablet Take 5 mg by mouth every morning before breakfast. (Patient not taking: Reported on 05/09/2020) hydrALAZINE (APRESOLINE) 25 MG tablet Take 25 mg by mouth continuously as needed (Patient not taking: Reported on 02/24/2022) lidocaine (LMX) 5 % cream Apply topically as directed. Apply topically to affected area 3-4 times daily prn (Patient not taking: Reported on 02/24/2022) 300 g 1 ondansetron (ZOFRAN) 8 MG tablet Take 1 tablet (8 mg total) by mouth every 8 (eight) hours as needed for Nausea (Patient not taking: Reported on 02/24/2022) 60 tablet 0  No current facility-administered medications on file prior to visit.  Family History Problem Relation Age of Onset Depression Mother Diabetes type II Mother Rheum arthritis Mother Myocardial Infarction (Heart attack) Father Heart disease Father   Social History  Tobacco Use Smoking Status Former Current packs/day: 0.00 Average packs/day: 1 pack/day for 32.4 years (32.4 ttl pk-yrs) Types: Cigarettes Start date: 04/13/1964 Quit date: 08/25/1996 Years since quitting: 26.1 Smokeless Tobacco Never   Social History  Socioeconomic History Marital status: Married Tobacco Use Smoking status: Former Current packs/day: 0.00 Average packs/day: 1 pack/day for 32.4 years (32.4 ttl pk-yrs) Types: Cigarettes Start date: 04/13/1964 Quit date: 08/25/1996 Years since quitting: 26.1 Smokeless tobacco: Never Vaping Use Vaping status: Unknown Substance and Sexual Activity Alcohol use: Yes Alcohol/week: 2.0 - 3.0 standard drinks of alcohol Types: 2 - 3 Shots of liquor per week Comment: use sometimes for pain Drug use: Yes Comment: THC Sexual activity:  Never  Social Determinants of Health  Financial Resource Strain: Low Risk (10/02/2021) Received from [facility] [place] Overall Financial Resource Strain (CARDIA) Difficulty of Paying Living Expenses: Not hard at all Food Insecurity: No Food Insecurity (09/18/2021) Received from [facility] [place] Hunger Vital Sign Worried About Running Out of Food in the Last Year: Never  true Ran Out of Food in the Last Year: Never true Transportation Needs: No Transportation Needs (09/18/2021) Received from [facility], [facility] - Transportation Lack of Transportation (Medical): No Lack of Transportation (Non-Medical): No Stress: No Stress Concern Present (10/02/2021) Received from [facility], [facility] of Occupational Health - Occupational Stress Questionnaire Feeling of Stress : Not at all  Objective:  Vitals: 10/08/22 1530 BP: (!) 145/68 Pulse: 75 Temp: 36.4 C (97.6 F) Weight: 84.7 kg (186 lb 12.8 oz) Height: 189.2 cm (6' 2.5") PainSc: 0-No pain  Body mass index is 23.66 kg/m.  Constitutional: NAD; conversant; no deformities Eyes: Moist conjunctiva; no lid lag; anicteric; PERRL Neck: Trachea midline; no thyromegaly Lungs: Normal respiratory effort; no tactile fremitus CV: RRR; no palpable thrills; no pitting edema GI: Abd, nontender, nondistended, old trocar scars; no palpable hepatosplenomegaly MSK: Normal gait; no clubbing/cyanosis Psychiatric: Appropriate affect; alert and oriented x3 Lymphatic: No palpable cervical or axillary lymphadenopathy Skin: No rash, lesions or jaundice  Labs, Imaging and Diagnostic Testing: Dr Henreitta Leber office note 09/01/22  Nephrology office note 05/13/22  Labs 02/2022 iron 65, vit d ok; bun 28, cr 2.04 GFR 35, cbc ok, pth 112  CT a/p w/o contrat 09/16/22 IMPRESSION: 1. Surgical changes of gastric bypass procedure. Contrast flows appropriately through the gastric pouch and alimentary limb. There is at least  mild thickening of the walls at the distal jejunojejunal anastomosis, however, oral contrast flows appropriately through this distal anastomosis and into the more distal small bowel without evidence of obstruction. Findings could represent a mild enteritis of infectious or inflammatory nature at this distal anastomosis, however, this is more likely a chronic postsurgical configuration/thickening. 2. No evidence of bowel obstruction. No evidence of obstructing intussusception. 3. 7 mm nonobstructing LEFT renal stone versus cortical calcification. No ureteral or bladder calculi are identified. 4. Degenerative spondylosis within the lower lumbar spine, mild to moderate in degree.  UpperGI 07/2019 Thoracic esophagus is normal. Gastric bypass with Roux-en-Y noted. Anastomosis appears to be intact. No barium leakage noted. No evidence of anastomotic stricture. No evidence of ulceration. Small-bowel fold thickness and caliber normal. No evidence of small-bowel obstruction. Terminal ileal region appears normal.  IMPRESSION: Gastric bypass with Roux-en-Y. No focal abnormalities identified. Small bowel appears normal.  OSH report CT [facility] Name: AZIR, MUZYKA [address] Phys: ADAMS,ERICA W FNP [address] DOB: 1950-10-24 Age: 89 Sex: [place], [place] [address] Acct: [id number] Loc: DA.CT PHONE #: [phone] Exam Date: 06/18/2022 Status: REG CLI FAX #: [phone] Radiology No: Unit No: [id number] EXAMS: Reason for exam: CPT CODE: [id number] CT ABD/PEL W/O CONT ABD PAIN 74176 REASON FOR STUDY: ABD PAIN COMPARISON: None. TECHNIQUE: Multiple CT axial images of the abdomen and pelvis extending from the lung bases to the proximal femurs were obtained. Oral contrast was administered. FINDINGS: Lung bases: Lung bases appear clear. Skeletal findings: The bony structures demonstrate degenerative changes. Liver: The liver is normal in size without gross evidence of masses or  dilated intrahepatic ducts. Gallbladder: The gallbladder appears relatively unremarkable without calcified stones or suspicious right upper quadrant stranding. Pancreas: Pancreas is normal in size without evidence of mass, surrounding inflammation, or dilated pancreatic duct. Adrenal Glands: Adrenal glands are unremarkable without suspicious nodules. Kidneys: The right kidney is unremarkable. A nonobstructing 6 mm calcified left intrarenal stone is present. No evidence hydronephrosis or hydroureter. Spleen: Spleen is normal in density. Vasculature: The abdominal aorta and IVC are normal without abdominal aortic aneurysm. Retroperitoneum: No retroperitoneal  or pelvic adenopathy is identified. No ascites is identified. Bowel: No evidence of bowel edema or obstruction. There is large stool throughout the distal colon. Sequela of Nissen fundoplication and gastric bypass is noted is identified. No suspicious fluid collections. Bladder: The bladder is normally distended. IMPRESSION: 1. No acute intra-abdominal or pelvic process. 2. Sequela of Nissen fundoplication and gastric bypass without suspicious fluid collections or obstruction 3. Left nephrolithiasis without obstructive uropathy.  OSH report MRI [facility] Name: VIDAL, LAMPKINS [address] Phys: ADAMS,ERICA W FNP [address] DOB: [birth year] Age: 85 Sex: [place], [place] [address] Acct: [id number] Loc: DA.MRI PHONE #: [phone] Exam Date: 08/12/2022 Status: REG CLI FAX #: [phone] Radiology No: Unit No: [id number] EXAMS: Reason for exam: CPT CODE: [id number] MRI ABDOMEN W/O CONTRAS ABDOMINAL PAIN 25427 Reason for study: ABDOMINAL PAIN Comparison study: CT abdomen pelvis 06/18/22 TECHNIQUE: Multiplanar, multisequence MR images of the abdomen were obtained. No contrast was administered. FINDINGS: Evaluation is limited given lack of contrast. LIVER: Normal hepatic signal identified at both without and with intravenous contrast. No  hepatic mass identified. BILIARY: The gallbladder, intrahepatic ducts, and common bile duct appear normal. PANCREAS: No mass or ductal dilatation. SPLEEN: The spleen is normal in size with normal signal and contrast enhancement. ADRENALS: No evidence of adrenal mass. KIDNEYS: The kidneys are normal in size without evidence of mass or hydronephrosis. Tiny bilateral renal T2 hyperintense cysts are present. PERITONEUM / RETROPERITONEUM: No ascites. LYMPH NODES: No upper abdominal lymphadenopathy. VESSELS: The abdominal aorta and IVC appear normal in size. BONES AND SOFT TISSUES: The bony structures demonstrate degenerative changes. There is large stool throughout the colon. Postsurgical changes of the stomach are present. Focal small bowel intussusception within the left upper quadrant is identified, likely transient without evidence for obstruction or bowel wall edema (axial image 20, coronal image 15). IMPRESSION: 1. Likely transient focal small bowel intussusception within the left upper quadrant. 2. Postsurgical changes of the stomach. 3. Additional nonurgent findings as above.  GI office note 02/14/22 with reports of egd/colonscopy 2020  Assessment and Plan:   Diagnoses and all orders for this visit:  Intussusception of small bowel (CMS/HHS-HCC)  Diabetes mellitus type 2, noninsulin dependent (CMS/HHS-HCC)  History of Roux-en-Y gastric bypass  Secondary hyperparathyroidism of renal origin (CMS/HHS-HCC)  History of iron deficiency anemia  Stage 3b chronic kidney disease (CMS/HHS-HCC)    Based on his imaging I believe he is having intermittent intussusception near his jejunojejunostomy. Also the differential is a potential mesenteric defect in the transverse colon since he has had a retrocolic gastric bypass. But I think this is more consistent with an intermittent intussusception. We discussed that over time the jejunojejunostomy can dilate leading to intermittent intussusception which can  lead to small bowel obstruction. We discussed signs and symptoms which would prompt him to go to the emergency room for urgent evaluation. I recommended diagnostic laparoscopy with laparoscopic resection of his jejunojejunostomy with recreation of his jejunojejunostomy. We also talked about laparoscopic pexy. We discussed that there is a risk of recurrence with just pexing the Roux limb and the BP limb. We discussed the risk of recurrence is less with revision of his jejunojejunostomy. We will also look for any type of internal hernia/mesenteric defect. We discussed potentially having to convert to open.  I showed him images of intussusception on computer  We discussed risk and benefits including but not limited to bleeding, infection, injury to surrounding structures, leak, stricture, internal hernia, blood clot formation, perioperative cardiac  pulmonary events, postoperative ileus. We also discussed the risk of bleeding. We discussed the typical recovery. We discussed the typical hospitalization. We discussed that should he have a significant postoperative complication this could worsen his chronic kidney disease into full-blown renal failure.  He understands. He would like to proceed with surgical intervention I think that is completely reasonable given the MRI findings. He will meet with our surgery schedulers today to find a date and time.  This patient encounter took 65 minutes today to perform the following: take history, perform exam, review outside records, interpret imaging, counsel the patient on their diagnosis and document encounter, findings & plan in the EHR  No follow-ups on file.  Atlas Kuc Sherril Cong, MD General, Minimally Invasive, & Bariatric Surgery     Electronically signed by Gara Kroner, MD at 10/08/2022 4:25 PM EDT

## 2022-11-02 NOTE — Anesthesia Procedure Notes (Signed)
Procedure Name: Intubation Date/Time: 11/02/2022 7:47 AM  Performed by: Cleda Clarks, CRNAPre-anesthesia Checklist: Patient identified, Emergency Drugs available, Suction available and Patient being monitored Patient Re-evaluated:Patient Re-evaluated prior to induction Oxygen Delivery Method: Circle system utilized Preoxygenation: Pre-oxygenation with 100% oxygen Induction Type: IV induction Ventilation: Mask ventilation without difficulty Laryngoscope Size: Miller and 2 Grade View: Grade II Tube type: Oral Tube size: 7.0 mm Number of attempts: 1 Airway Equipment and Method: Stylet and Oral airway Placement Confirmation: ETT inserted through vocal cords under direct vision, positive ETCO2 and breath sounds checked- equal and bilateral Secured at: 21 cm Tube secured with: Tape Dental Injury: Teeth and Oropharynx as per pre-operative assessment

## 2022-11-02 NOTE — Op Note (Signed)
11/02/2022  9:46 AM  PATIENT:  Bryan Manning.  72 y.o. male  PRE-OPERATIVE DIAGNOSIS:  Chronic LUQ pain, HISTORY OF GASTRIC BYPASS  POST-OPERATIVE DIAGNOSIS:  chronic LUQ pain, HISTORY OF GASTRIC BYPASS  PROCEDURE:  Procedure(s): DIAGNOSTIC LAPAROSCOPY BILATERAL LAPAROSCOPIC TAP BLOCK  SURGEON:  Surgeon(s): Gaynelle Adu, MD   ASSISTANTS: Berna Bue, MD   ANESTHESIA:   general  DRAINS: none   LOCAL MEDICATIONS USED:  MARCAINE    and OTHER exparel  SPECIMEN:  No Specimen  DISPOSITION OF SPECIMEN:  N/A  COUNTS:  YES  INDICATION FOR PROCEDURE: 72 year old gentleman with a remote history of laparoscopic Roux-en-Y gastric bypass at [facility] in 2009 was referred for chronic left abdominal pain.  Patient had a remote history of laparoscopic Nissen fundoplication that was reversed during his Roux-en-Y gastric bypass.  He had a 21 mm EEA gastrojejunostomy with a retrocolic retrogastric Roux limb.  Roux limb was 100 cm.  BP limb was 60 cm.  All of this was from the outside operative note.  Patient has been having almost daily left abdominal pain for over a year.  All of his imaging had been negative.  He had 1 MRI at an outside facility that showed a nonobstructed area of intussusception in the left upper quadrant.  He was referred for evaluation.  We discussed that it was potential that he was having intermittent intussusception at or near his jejunojejunostomy.  We also needed to rule out an internal hernia as well.  We discussed pros and cons of diagnostic laparoscopy.  All of this was separately discussed and documented  PROCEDURE: Obtaining informed consent the patient was taken the OR 2 at [facility] and placed supine on the operating room table.  General endotracheal anesthesia was established.  Sequential compression devices were placed.  Patient's abdomen was prepped and draped in the usual standard surgical fashion with ChloraPrep.  IV antibiotic is administered.   Surgical timeout was performed.  Access to the abdomen was gained using the Optiview technique.  A small incision was made at Palmer's point.  Then using a 0 degree 5 mm laparoscope through a 5 mm trocar the trocar with a camera was advanced through all layers of the abdominal wall and carefully entered the abdominal cavity.  Pneumoperitoneum was smoothly established to a patient pressure of 15 mmHg.  There is no evidence of injury to surrounding viscera.  Additional 5 mm trocars were placed 1 slightly above into the left of the umbilicus, 2 in the right abdomen and 1 in the left lateral upper abdominal wall.  A bilateral laparoscopic tap block was performed for postoperative pain relief.  The omentum was lifted up to expose the transverse colon.  We lifted up the transverse colon and identified the Roux limb coming through the transverse colon mesentery.  There is no defect in the transverse colon mesentery where the Roux limb came through it.  There was no evidence of a Petersons hernia space either.  We then ran the Roux limb down to the jejunojejunostomy.  The jejunojejunostomy did not appear dilated or thickened or patulous.  The BP limb was identified and ran back to the ligament of Treitz.  The BP limb was not overtly dilated.  The BP limb wall was perhaps a little bit thicker than the Roux limb wall.  But we did not palpate any masses within the BP limb or Roux limb.  We then came back down to the jejunojejunostomy and ran the common  channel to the terminal ileum.  My partner then ran the bowel starting at the cecum and the terminal ileum back proximately.  There was no abnormal small bowel in the common channel.  She ran it back all the way up to the jejunojejunostomy.  We reinspected the jejunojejunostomy again and there was no evidence of a mesenteric defect.  We then reinspected the jejunojejunostomy.  Again there was not patulous.  There is no intussusception present.  The distal transverse colon and  splenic flexure with a very large stool burden.  One of our other bariatric partners came into the OR and looked at the anatomy.  We discussed pros and cons of resecting the jejunojejunostomy.  We reviewed the MRI.  I discussed the MRI with a radiologist on the phone.  This could have been a transient intussusception that was incidental and not the source of his chronic left abdominal pain.  Since the jejunojejunostomy was not atypical in appearance we did not feel it was worth the risk of resection today.  We agreed that putting the patient on a more aggressive bowel regimen to see if perhaps his pain was coming from a significant stool burden was the source of his abdominal pain.  And seeing how that does for him and go from there.  Trocars were removed and the skin incisions were closed with a 4-0 Monocryl followed by application of Dermabond.  All needle, instrument, and sponge counts were correct x 2.  There were no immediate complications.  PLAN OF CARE: Discharge to home after PACU  PATIENT DISPOSITION:  PACU - hemodynamically stable.   Delay start of Pharmacological VTE agent (>24hrs) due to surgical blood loss or risk of bleeding:  not applicable  Mary Sella. Andrey Campanile, MD, FACS General, Bariatric, & Minimally Invasive Surgery [facility], [place]

## 2022-11-02 NOTE — Anesthesia Postprocedure Evaluation (Signed)
Anesthesia Post Note  Patient: Bryan Manning.  Procedure(s) Performed: DIAGNOSTIC LAPAROSCOPY     Patient location during evaluation: PACU Anesthesia Type: General Level of consciousness: awake and alert, oriented and patient cooperative Pain management: pain level controlled Vital Signs Assessment: post-procedure vital signs reviewed and stable Respiratory status: spontaneous breathing, nonlabored ventilation and respiratory function stable Cardiovascular status: blood pressure returned to baseline and stable Postop Assessment: no apparent nausea or vomiting Anesthetic complications: no   No notable events documented.  Last Vitals:  Vitals:   11/02/22 1000 11/02/22 1015  BP: (!) 155/70 (!) 141/54  Pulse: 82 77  Resp: 16 15  Temp:    SpO2: 100% 95%    Last Pain:  Vitals:   11/02/22 1030  TempSrc:   PainSc: 4                  Lannie Fields

## 2022-11-02 NOTE — Discharge Instructions (Signed)

## 2022-11-03 ENCOUNTER — Encounter (HOSPITAL_COMMUNITY): Payer: Self-pay | Admitting: General Surgery

## 2023-01-12 ENCOUNTER — Encounter: Payer: Self-pay | Admitting: Dermatology

## 2023-01-12 ENCOUNTER — Ambulatory Visit (INDEPENDENT_AMBULATORY_CARE_PROVIDER_SITE_OTHER): Payer: Medicare Other | Admitting: Dermatology

## 2023-01-12 VITALS — BP 154/61

## 2023-01-12 DIAGNOSIS — L570 Actinic keratosis: Secondary | ICD-10-CM | POA: Diagnosis not present

## 2023-01-12 DIAGNOSIS — W908XXA Exposure to other nonionizing radiation, initial encounter: Secondary | ICD-10-CM

## 2023-01-12 DIAGNOSIS — D692 Other nonthrombocytopenic purpura: Secondary | ICD-10-CM

## 2023-01-12 DIAGNOSIS — L578 Other skin changes due to chronic exposure to nonionizing radiation: Secondary | ICD-10-CM | POA: Diagnosis not present

## 2023-01-12 NOTE — Patient Instructions (Addendum)

## 2023-01-12 NOTE — Progress Notes (Signed)
   Follow-Up Visit   Subjective  Bryan Manning. is a 72 y.o. male who presents for the following: AK 80m f/u, scalp, used 5FU/ Calcipotriene bid x 7 days and did not get a reaction The patient has spots, moles and lesions to be evaluated, some may be new or changing and the patient may have concern these could be cancer.   The following portions of the chart were reviewed this encounter and updated as appropriate: medications, allergies, medical history  Review of Systems:  No other skin or systemic complaints except as noted in HPI or Assessment and Plan.  Objective  Well appearing patient in no apparent distress; mood and affect are within normal limits.   A focused examination was performed of the following areas: Face, scalp, arms, hands  Relevant exam findings are noted in the Assessment and Plan.  Scalp x 4 (4) Pink scaly macules    Assessment & Plan   ACTINIC DAMAGE - chronic, secondary to cumulative UV radiation exposure/sun exposure over time - diffuse scaly erythematous macules with underlying dyspigmentation - Recommend daily broad spectrum sunscreen SPF 30+ to sun-exposed areas, reapply every 2 hours as needed.  - Recommend staying in the shade or wearing long sleeves, sun glasses (UVA+UVB protection) and wide brim hats (4-inch brim around the entire circumference of the hat). - Call for new or changing lesions.   AK (actinic keratosis) (4) Scalp x 4  Actinic keratoses are precancerous spots that appear secondary to cumulative UV radiation exposure/sun exposure over time. They are chronic with expected duration over 1 year. A portion of actinic keratoses will progress to squamous cell carcinoma of the skin. It is not possible to reliably predict which spots will progress to skin cancer and so treatment is recommended to prevent development of skin cancer.  Recommend daily broad spectrum sunscreen SPF 30+ to sun-exposed areas, reapply every 2 hours as needed.   Recommend staying in the shade or wearing long sleeves, sun glasses (UVA+UVB protection) and wide brim hats (4-inch brim around the entire circumference of the hat). Call for new or changing lesions.  Destruction of lesion - Scalp x 4 (4) Complexity: simple   Destruction method: cryotherapy   Informed consent: discussed and consent obtained   Timeout:  patient name, date of birth, surgical site, and procedure verified Lesion destroyed using liquid nitrogen: Yes   Region frozen until ice ball extended beyond lesion: Yes   Outcome: patient tolerated procedure well with no complications   Post-procedure details: wound care instructions given    Purpura - Chronic; persistent and recurrent.  Treatable, but not curable. - Violaceous macules and patches - Benign - Related to trauma, age, sun damage and/or use of blood thinners, chronic use of topical and/or oral steroids - Observe - Can use OTC arnica containing moisturizer such as Dermend Bruise Formula if desired - Call for worsening or other concerns   Return in about 1 year (around 01/12/2024) for TBSE, Hx of BCC, Hx of AKs.  I, Ardis Rowan, RMA, am acting as scribe for Armida Sans, MD .   Documentation: I have reviewed the above documentation for accuracy and completeness, and I agree with the above.  Armida Sans, MD

## 2023-02-12 ENCOUNTER — Other Ambulatory Visit: Payer: Self-pay | Admitting: General Surgery

## 2023-02-12 DIAGNOSIS — R109 Unspecified abdominal pain: Secondary | ICD-10-CM

## 2023-02-19 ENCOUNTER — Other Ambulatory Visit: Payer: Self-pay | Admitting: General Surgery

## 2023-02-19 ENCOUNTER — Ambulatory Visit
Admission: RE | Admit: 2023-02-19 | Discharge: 2023-02-19 | Disposition: A | Discharge status: Home / Self Care | Payer: Medicare Other | Source: Ambulatory Visit | Attending: General Surgery | Admitting: General Surgery

## 2023-02-19 DIAGNOSIS — R109 Unspecified abdominal pain: Secondary | ICD-10-CM

## 2023-04-16 ENCOUNTER — Encounter (HOSPITAL_COMMUNITY): Payer: Self-pay

## 2023-04-16 ENCOUNTER — Emergency Department (HOSPITAL_COMMUNITY): Payer: Medicare Other

## 2023-04-16 ENCOUNTER — Other Ambulatory Visit: Payer: Self-pay

## 2023-04-16 ENCOUNTER — Emergency Department (HOSPITAL_COMMUNITY)
Admission: EM | Admit: 2023-04-16 | Discharge: 2023-04-16 | Disposition: A | Discharge status: Home / Self Care | Payer: Medicare Other | Attending: Emergency Medicine | Admitting: Emergency Medicine

## 2023-04-16 DIAGNOSIS — R079 Chest pain, unspecified: Secondary | ICD-10-CM

## 2023-04-16 DIAGNOSIS — R600 Localized edema: Secondary | ICD-10-CM | POA: Insufficient documentation

## 2023-04-16 DIAGNOSIS — Z79899 Other long term (current) drug therapy: Secondary | ICD-10-CM | POA: Insufficient documentation

## 2023-04-16 DIAGNOSIS — Z20822 Contact with and (suspected) exposure to covid-19: Secondary | ICD-10-CM | POA: Diagnosis not present

## 2023-04-16 DIAGNOSIS — N189 Chronic kidney disease, unspecified: Secondary | ICD-10-CM | POA: Insufficient documentation

## 2023-04-16 DIAGNOSIS — R051 Acute cough: Secondary | ICD-10-CM | POA: Diagnosis not present

## 2023-04-16 DIAGNOSIS — R0789 Other chest pain: Secondary | ICD-10-CM | POA: Diagnosis present

## 2023-04-16 LAB — CBC WITH DIFFERENTIAL/PLATELET
Abs Immature Granulocytes: 0.01 10*3/uL (ref 0.00–0.07)
Basophils Absolute: 0 10*3/uL (ref 0.0–0.1)
Basophils Relative: 0 %
Eosinophils Absolute: 0.2 10*3/uL (ref 0.0–0.5)
Eosinophils Relative: 2 %
HCT: 34 % — ABNORMAL LOW (ref 39.0–52.0)
Hemoglobin: 10.8 g/dL — ABNORMAL LOW (ref 13.0–17.0)
Immature Granulocytes: 0 %
Lymphocytes Relative: 16 %
Lymphs Abs: 1.1 10*3/uL (ref 0.7–4.0)
MCH: 30.3 pg (ref 26.0–34.0)
MCHC: 31.8 g/dL (ref 30.0–36.0)
MCV: 95.2 fL (ref 80.0–100.0)
Monocytes Absolute: 0.4 10*3/uL (ref 0.1–1.0)
Monocytes Relative: 5 %
Neutro Abs: 5.3 10*3/uL (ref 1.7–7.7)
Neutrophils Relative %: 77 %
Platelets: 210 10*3/uL (ref 150–400)
RBC: 3.57 MIL/uL — ABNORMAL LOW (ref 4.22–5.81)
RDW: 13.2 % (ref 11.5–15.5)
WBC: 6.9 10*3/uL (ref 4.0–10.5)
nRBC: 0 % (ref 0.0–0.2)

## 2023-04-16 LAB — BASIC METABOLIC PANEL
Anion gap: 7 (ref 5–15)
BUN: 21 mg/dL (ref 8–23)
CO2: 23 mmol/L (ref 22–32)
Calcium: 9.1 mg/dL (ref 8.9–10.3)
Chloride: 106 mmol/L (ref 98–111)
Creatinine, Ser: 2.69 mg/dL — ABNORMAL HIGH (ref 0.61–1.24)
GFR, Estimated: 25 mL/min — ABNORMAL LOW (ref >60)
Glucose, Bld: 166 mg/dL — ABNORMAL HIGH (ref 70–99)
Potassium: 3.7 mmol/L (ref 3.5–5.1)
Sodium: 136 mmol/L (ref 135–145)

## 2023-04-16 LAB — BRAIN NATRIURETIC PEPTIDE: B Natriuretic Peptide: 97 pg/mL (ref 0.0–100.0)

## 2023-04-16 LAB — SARS CORONAVIRUS 2 BY RT PCR: SARS Coronavirus 2 by RT PCR: NEGATIVE

## 2023-04-16 LAB — TROPONIN I (HIGH SENSITIVITY): Troponin I (High Sensitivity): 11 ng/L (ref <18)

## 2023-04-16 MED ORDER — ALBUTEROL SULFATE HFA 108 (90 BASE) MCG/ACT IN AERS
2.0000 | INHALATION_SPRAY | Freq: Once | RESPIRATORY_TRACT | Status: AC
  Administered 2023-04-16: 2 via RESPIRATORY_TRACT
  Filled 2023-04-16: qty 6.7

## 2023-04-16 MED ORDER — ACETAMINOPHEN 500 MG PO TABS: 1000.0000 mg | ORAL_TABLET | Freq: Once | ORAL | Status: DC

## 2023-04-16 NOTE — Discharge Instructions (Signed)
 You were seen in the emergency department for some left-sided chest pain with cough.  You had lab work and x-ray that did not show any evidence of heart injury or pneumonia.  Your renal function is slightly worse and this will need to be monitored with your primary care doctor.  You can use Tylenol  for pain.  Warm compresses to your chest wall area.  Return if any worsening or concerning symptoms

## 2023-04-16 NOTE — ED Triage Notes (Signed)
 Pt c/o CP when coughing. Pt states having a cough for the past 2 days. Pt states productive cough but no n/v, fever, or SOB. Pt states CP in left chest wall when coughing occasionally radiates to back.

## 2023-04-16 NOTE — ED Provider Notes (Signed)
 [place] EMERGENCY DEPARTMENT AT [facility] Provider Note   CSN: [id number] Arrival date & time: 04/16/23  9061     History  Chief Complaint  Patient presents with   Chest Pain    Hitoshi Werts. is a 73 y.o. male.  He is complaining of a sharp stabbing chest pain of his left anterior chest that is been going on for 2 days.  He said it will sometimes happen randomly but will definitely happen when he coughs hard.  He has had a mostly nonproductive cough but sometimes has some clear sputum.  No fevers chills nausea vomiting.  Pain is brief and lasts for a second or 2.  Does not radiate to his jaw or shoulder.  Does not feel short of breath.  He tells me he just wants to make sure it is not a heart attack.  The history is provided by the patient.  Chest Pain Pain location:  L chest Pain quality: stabbing   Pain radiates to:  Does not radiate Pain severity:  Moderate Onset quality:  Gradual Duration:  3 days Timing:  Sporadic Progression:  Unchanged Chronicity:  New Context comment:  Coughing Relieved by:  None tried Worsened by:  Coughing Ineffective treatments:  None tried Associated symptoms: cough   Associated symptoms: no diaphoresis, no fever, no nausea, no shortness of breath and no vomiting   Risk factors: high cholesterol        Home Medications Prior to Admission medications   Medication Sig Start Date End Date Taking? Authorizing Provider  amLODipine  (NORVASC ) 5 MG tablet Take 5 mg by mouth daily. 05/13/22   [provider]  ARIPiprazole  (ABILIFY ) 20 MG tablet Take 20 mg by mouth daily. 05/21/22   [provider]  atorvastatin  (LIPITOR) 10 MG tablet Take 10 mg by mouth daily. 09/09/22   [provider]  FLUoxetine  (PROZAC ) 40 MG capsule Take 40 mg by mouth daily. 05/21/22   [provider]  tamsulosin  (FLOMAX ) 0.4 MG CAPS capsule Take 0.4 mg by mouth daily.    [provider]      Allergies    Iodinated  contrast media, Oxytetracycline, Ropinirole, Silicone, Tape, Amoxicillin, Tetracycline, and Nsaids    Review of Systems   Review of Systems  Constitutional:  Negative for diaphoresis and fever.  Respiratory:  Positive for cough. Negative for shortness of breath.   Cardiovascular:  Positive for chest pain.  Gastrointestinal:  Negative for nausea and vomiting.    Physical Exam Updated Vital Signs BP (!) 153/66   Pulse 64   Temp 98.2 F (36.8 C) (Oral)   Resp 17   Ht 6' 2.5 (1.892 m)   Wt 82.6 kg   SpO2 98%   BMI 23.05 kg/m  Physical Exam Vitals and nursing note reviewed.  Constitutional:      General: He is not in acute distress.    Appearance: He is well-developed.  HENT:     Head: Normocephalic and atraumatic.  Eyes:     Conjunctiva/sclera: Conjunctivae normal.  Cardiovascular:     Rate and Rhythm: Normal rate and regular rhythm.     Heart sounds: Normal heart sounds. No murmur heard. Pulmonary:     Effort: Pulmonary effort is normal. No respiratory distress.     Breath sounds: Normal breath sounds.  Abdominal:     Palpations: Abdomen is soft.     Tenderness: There is no abdominal tenderness.  Musculoskeletal:        General:  No swelling.     Cervical back: Neck supple.     Right lower leg: No tenderness. Edema present.     Left lower leg: No tenderness. Edema present.  Skin:    General: Skin is warm and dry.     Capillary Refill: Capillary refill takes less than 2 seconds.  Neurological:     General: No focal deficit present.     Mental Status: He is alert.  Psychiatric:        Mood and Affect: Mood normal.     ED Results / Procedures / Treatments   Labs (all labs ordered are listed, but only abnormal results are displayed) Labs Reviewed  BASIC METABOLIC PANEL - Abnormal; Notable for the following components:      Result Value   Glucose, Bld 166 (*)    Creatinine, Ser 2.69 (*)    GFR, Estimated 25 (*)    All other components within normal limits   CBC WITH DIFFERENTIAL/PLATELET - Abnormal; Notable for the following components:   RBC 3.57 (*)    Hemoglobin 10.8 (*)    HCT 34.0 (*)    All other components within normal limits  SARS CORONAVIRUS 2 BY RT PCR  BRAIN NATRIURETIC PEPTIDE  TROPONIN I (HIGH SENSITIVITY)    EKG EKG Interpretation Date/Time:  Friday April 16 2023 09:56:03 EST Ventricular Rate:  59 PR Interval:  169 QRS Duration:  153 QT Interval:  487 QTC Calculation: 483 R Axis:   -21  Text Interpretation: Sinus rhythm Left bundle branch block No significant change since prior 7/24 Confirmed by Towana Sharper [phone]) on 04/16/2023 10:03:34 AM  Radiology DG Chest Port 1 View Result Date: 04/16/2023 CLINICAL DATA:  Cough and chest pain. EXAM: PORTABLE CHEST 1 VIEW COMPARISON:  None Available. FINDINGS: Bilateral lungs appear hyperlucent with coarse bronchovascular markings, concerning for mild underlying COPD. Bilateral lungs otherwise appear clear. No dense consolidation. Bilateral lung fields are clear. Left lateral costophrenic angle is clear. Right lateral costophrenic angle is not included in the film. However, no large pleural effusion or pneumothorax seen on either side. Normal cardio-mediastinal silhouette. No acute osseous abnormalities. Right shoulder arthroplasty is partially seen. The soft tissues are within normal limits. IMPRESSION: *Probable mild underlying COPD. No acute cardiopulmonary abnormality. Electronically Signed   By: Ree Molt M.D.   On: 04/16/2023 11:16    Procedures Procedures    Medications Ordered in ED Medications  albuterol  (VENTOLIN  HFA) 108 (90 Base) MCG/ACT inhaler 2 puff (2 puffs Inhalation Given 04/16/23 1040)    ED Course/ Medical Decision Making/ A&P Clinical Course as of 04/16/23 1709  Fri Apr 16, 2023  1030 Chest x-ray interpreted by me as no acute infiltrate.  Awaiting radiology reading. [MB]  1126 Reviewed results with patient.  Do not feel he needs a delta troponin as  this has been ongoing for a few days.  He is aware of his GFR, said he saw his nephrologist a few weeks ago and it was 21. [MB]    Clinical Course User Index [MB] Towana Sharper BROCKS, MD                                 Medical Decision Making Amount and/or Complexity of Data Reviewed Labs: ordered. Radiology: ordered.  Risk Prescription drug management.   This patient complains of sharp pain in his chest intermittently with cough; this involves an extensive number of treatment Options and is  a complaint that carries with it a high risk of complications and morbidity. The differential includes cough, musculoskeletal, pneumonia, pneumothorax, PE, ACS  I ordered, reviewed and interpreted labs, which included CBC with normal white count acutely hemoglobin slightly down from priors, chemistries with worsening CKD, elevated glucose, troponins flat, BNP normal, COVID-negative I ordered medication breathing treatment and reviewed PMP when indicated. I ordered imaging studies which included chest x-ray and I independently    visualized and interpreted imaging which showed no acute findings Previous records obtained and reviewed in epic no recent admissions Cardiac monitoring reviewed, sinus rhythm Social determinants considered, no significant barriers Critical Interventions: None  After the interventions stated above, I reevaluated the patient and found patient to be resting comfortably satting well on room air in no distress Admission and further testing considered, no indications for further cardiac workup at this time.  Patient comfortable plan for outpatient follow-up with his PCP.  Return instructions discussed         Final Clinical Impression(s) / ED Diagnoses Final diagnoses:  Nonspecific chest pain  Acute cough  Chronic kidney disease, unspecified CKD stage    Rx / DC Orders ED Discharge Orders     None         Towana Ozell BROCKS, MD 04/16/23 1714

## 2023-05-24 ENCOUNTER — Ambulatory Visit: Payer: Medicare Other

## 2023-05-24 DIAGNOSIS — Z980 Intestinal bypass and anastomosis status: Secondary | ICD-10-CM | POA: Diagnosis not present

## 2023-05-24 DIAGNOSIS — R112 Nausea with vomiting, unspecified: Secondary | ICD-10-CM | POA: Diagnosis not present

## 2023-05-24 DIAGNOSIS — Z9884 Bariatric surgery status: Secondary | ICD-10-CM | POA: Diagnosis not present

## 2023-05-24 DIAGNOSIS — R1012 Left upper quadrant pain: Secondary | ICD-10-CM | POA: Diagnosis not present

## 2023-10-20 DIAGNOSIS — E782 Mixed hyperlipidemia: Secondary | ICD-10-CM | POA: Diagnosis present

## 2023-10-20 DIAGNOSIS — F419 Anxiety disorder, unspecified: Secondary | ICD-10-CM | POA: Diagnosis present

## 2023-10-20 DIAGNOSIS — Z85828 Personal history of other malignant neoplasm of skin: Secondary | ICD-10-CM

## 2023-10-20 DIAGNOSIS — Z9884 Bariatric surgery status: Secondary | ICD-10-CM

## 2023-10-20 DIAGNOSIS — Z91041 Radiographic dye allergy status: Secondary | ICD-10-CM

## 2023-10-20 DIAGNOSIS — E1122 Type 2 diabetes mellitus with diabetic chronic kidney disease: Secondary | ICD-10-CM | POA: Diagnosis present

## 2023-10-20 DIAGNOSIS — I5031 Acute diastolic (congestive) heart failure: Secondary | ICD-10-CM | POA: Diagnosis present

## 2023-10-20 DIAGNOSIS — M109 Gout, unspecified: Secondary | ICD-10-CM | POA: Diagnosis present

## 2023-10-20 DIAGNOSIS — D631 Anemia in chronic kidney disease: Secondary | ICD-10-CM | POA: Diagnosis present

## 2023-10-20 DIAGNOSIS — K219 Gastro-esophageal reflux disease without esophagitis: Secondary | ICD-10-CM | POA: Diagnosis present

## 2023-10-20 DIAGNOSIS — Z96611 Presence of right artificial shoulder joint: Secondary | ICD-10-CM | POA: Diagnosis present

## 2023-10-20 DIAGNOSIS — M898X9 Other specified disorders of bone, unspecified site: Secondary | ICD-10-CM | POA: Diagnosis present

## 2023-10-20 DIAGNOSIS — R9431 Abnormal electrocardiogram [ECG] [EKG]: Secondary | ICD-10-CM | POA: Diagnosis present

## 2023-10-20 DIAGNOSIS — E872 Acidosis, unspecified: Secondary | ICD-10-CM | POA: Diagnosis present

## 2023-10-20 DIAGNOSIS — Z87891 Personal history of nicotine dependence: Secondary | ICD-10-CM

## 2023-10-20 DIAGNOSIS — N185 Chronic kidney disease, stage 5: Secondary | ICD-10-CM | POA: Diagnosis present

## 2023-10-20 DIAGNOSIS — Z79899 Other long term (current) drug therapy: Secondary | ICD-10-CM

## 2023-10-20 DIAGNOSIS — F319 Bipolar disorder, unspecified: Secondary | ICD-10-CM | POA: Diagnosis present

## 2023-10-20 DIAGNOSIS — Z8249 Family history of ischemic heart disease and other diseases of the circulatory system: Secondary | ICD-10-CM

## 2023-10-20 DIAGNOSIS — I132 Hypertensive heart and chronic kidney disease with heart failure and with stage 5 chronic kidney disease, or end stage renal disease: Secondary | ICD-10-CM | POA: Diagnosis not present

## 2023-10-20 DIAGNOSIS — N179 Acute kidney failure, unspecified: Secondary | ICD-10-CM | POA: Diagnosis present

## 2023-10-20 DIAGNOSIS — J8410 Pulmonary fibrosis, unspecified: Secondary | ICD-10-CM | POA: Diagnosis present

## 2023-10-20 DIAGNOSIS — R0789 Other chest pain: Secondary | ICD-10-CM | POA: Diagnosis not present

## 2023-10-20 DIAGNOSIS — G4733 Obstructive sleep apnea (adult) (pediatric): Secondary | ICD-10-CM | POA: Diagnosis present

## 2023-10-20 DIAGNOSIS — I447 Left bundle-branch block, unspecified: Secondary | ICD-10-CM | POA: Diagnosis present

## 2023-10-20 DIAGNOSIS — N400 Enlarged prostate without lower urinary tract symptoms: Secondary | ICD-10-CM | POA: Diagnosis present

## 2023-10-21 ENCOUNTER — Emergency Department (HOSPITAL_COMMUNITY)

## 2023-10-21 ENCOUNTER — Encounter (HOSPITAL_COMMUNITY): Payer: Self-pay

## 2023-10-21 ENCOUNTER — Inpatient Hospital Stay (HOSPITAL_COMMUNITY)

## 2023-10-21 ENCOUNTER — Inpatient Hospital Stay (HOSPITAL_COMMUNITY)
Admission: EM | Admit: 2023-10-21 | Discharge: 2023-10-23 | DRG: 291 | Disposition: A | Discharge status: Home / Self Care | Attending: Internal Medicine | Admitting: Internal Medicine

## 2023-10-21 ENCOUNTER — Other Ambulatory Visit: Payer: Self-pay

## 2023-10-21 ENCOUNTER — Other Ambulatory Visit (HOSPITAL_COMMUNITY): Payer: Self-pay | Admitting: *Deleted

## 2023-10-21 DIAGNOSIS — I447 Left bundle-branch block, unspecified: Secondary | ICD-10-CM

## 2023-10-21 DIAGNOSIS — Z79899 Other long term (current) drug therapy: Secondary | ICD-10-CM | POA: Diagnosis not present

## 2023-10-21 DIAGNOSIS — E782 Mixed hyperlipidemia: Secondary | ICD-10-CM | POA: Insufficient documentation

## 2023-10-21 DIAGNOSIS — F419 Anxiety disorder, unspecified: Secondary | ICD-10-CM | POA: Diagnosis present

## 2023-10-21 DIAGNOSIS — Z96611 Presence of right artificial shoulder joint: Secondary | ICD-10-CM | POA: Diagnosis present

## 2023-10-21 DIAGNOSIS — R7989 Other specified abnormal findings of blood chemistry: Secondary | ICD-10-CM | POA: Insufficient documentation

## 2023-10-21 DIAGNOSIS — E8779 Other fluid overload: Secondary | ICD-10-CM

## 2023-10-21 DIAGNOSIS — J8410 Pulmonary fibrosis, unspecified: Secondary | ICD-10-CM | POA: Diagnosis present

## 2023-10-21 DIAGNOSIS — E872 Acidosis, unspecified: Secondary | ICD-10-CM | POA: Insufficient documentation

## 2023-10-21 DIAGNOSIS — Z91041 Radiographic dye allergy status: Secondary | ICD-10-CM | POA: Diagnosis not present

## 2023-10-21 DIAGNOSIS — R9431 Abnormal electrocardiogram [ECG] [EKG]: Secondary | ICD-10-CM | POA: Insufficient documentation

## 2023-10-21 DIAGNOSIS — R0789 Other chest pain: Secondary | ICD-10-CM | POA: Diagnosis present

## 2023-10-21 DIAGNOSIS — I10 Essential (primary) hypertension: Secondary | ICD-10-CM | POA: Diagnosis present

## 2023-10-21 DIAGNOSIS — F319 Bipolar disorder, unspecified: Secondary | ICD-10-CM | POA: Diagnosis present

## 2023-10-21 DIAGNOSIS — F32A Depression, unspecified: Secondary | ICD-10-CM | POA: Insufficient documentation

## 2023-10-21 DIAGNOSIS — G4733 Obstructive sleep apnea (adult) (pediatric): Secondary | ICD-10-CM | POA: Diagnosis present

## 2023-10-21 DIAGNOSIS — M109 Gout, unspecified: Secondary | ICD-10-CM | POA: Diagnosis present

## 2023-10-21 DIAGNOSIS — Z8249 Family history of ischemic heart disease and other diseases of the circulatory system: Secondary | ICD-10-CM | POA: Diagnosis not present

## 2023-10-21 DIAGNOSIS — I509 Heart failure, unspecified: Secondary | ICD-10-CM | POA: Diagnosis not present

## 2023-10-21 DIAGNOSIS — Z87891 Personal history of nicotine dependence: Secondary | ICD-10-CM | POA: Diagnosis not present

## 2023-10-21 DIAGNOSIS — E1122 Type 2 diabetes mellitus with diabetic chronic kidney disease: Secondary | ICD-10-CM | POA: Diagnosis present

## 2023-10-21 DIAGNOSIS — K219 Gastro-esophageal reflux disease without esophagitis: Secondary | ICD-10-CM | POA: Diagnosis present

## 2023-10-21 DIAGNOSIS — Z9884 Bariatric surgery status: Secondary | ICD-10-CM | POA: Diagnosis not present

## 2023-10-21 DIAGNOSIS — N179 Acute kidney failure, unspecified: Secondary | ICD-10-CM | POA: Diagnosis present

## 2023-10-21 DIAGNOSIS — I5031 Acute diastolic (congestive) heart failure: Secondary | ICD-10-CM

## 2023-10-21 DIAGNOSIS — N400 Enlarged prostate without lower urinary tract symptoms: Secondary | ICD-10-CM | POA: Insufficient documentation

## 2023-10-21 DIAGNOSIS — N185 Chronic kidney disease, stage 5: Secondary | ICD-10-CM | POA: Diagnosis present

## 2023-10-21 DIAGNOSIS — D631 Anemia in chronic kidney disease: Secondary | ICD-10-CM | POA: Diagnosis present

## 2023-10-21 DIAGNOSIS — Z85828 Personal history of other malignant neoplasm of skin: Secondary | ICD-10-CM | POA: Diagnosis not present

## 2023-10-21 DIAGNOSIS — I132 Hypertensive heart and chronic kidney disease with heart failure and with stage 5 chronic kidney disease, or end stage renal disease: Secondary | ICD-10-CM | POA: Diagnosis present

## 2023-10-21 LAB — BASIC METABOLIC PANEL WITH GFR
Anion gap: 14 (ref 5–15)
BUN: 81 mg/dL — ABNORMAL HIGH (ref 8–23)
CO2: 16 mmol/L — ABNORMAL LOW (ref 22–32)
Calcium: 9.1 mg/dL (ref 8.9–10.3)
Chloride: 106 mmol/L (ref 98–111)
Creatinine, Ser: 5.35 mg/dL — ABNORMAL HIGH (ref 0.61–1.24)
GFR, Estimated: 11 mL/min — ABNORMAL LOW (ref >60)
Glucose, Bld: 108 mg/dL — ABNORMAL HIGH (ref 70–99)
Potassium: 3.9 mmol/L (ref 3.5–5.1)
Sodium: 136 mmol/L (ref 135–145)

## 2023-10-21 LAB — CBC WITH DIFFERENTIAL/PLATELET
Abs Immature Granulocytes: 0.02 K/uL (ref 0.00–0.07)
Basophils Absolute: 0.1 K/uL (ref 0.0–0.1)
Basophils Relative: 1 %
Eosinophils Absolute: 0.4 K/uL (ref 0.0–0.5)
Eosinophils Relative: 4 %
HCT: 27.8 % — ABNORMAL LOW (ref 39.0–52.0)
Hemoglobin: 9.3 g/dL — ABNORMAL LOW (ref 13.0–17.0)
Immature Granulocytes: 0 %
Lymphocytes Relative: 14 %
Lymphs Abs: 1.3 K/uL (ref 0.7–4.0)
MCH: 32.5 pg (ref 26.0–34.0)
MCHC: 33.5 g/dL (ref 30.0–36.0)
MCV: 97.2 fL (ref 80.0–100.0)
Monocytes Absolute: 0.7 K/uL (ref 0.1–1.0)
Monocytes Relative: 7 %
Neutro Abs: 6.9 K/uL (ref 1.7–7.7)
Neutrophils Relative %: 74 %
Platelets: 291 K/uL (ref 150–400)
RBC: 2.86 MIL/uL — ABNORMAL LOW (ref 4.22–5.81)
RDW: 13.2 % (ref 11.5–15.5)
WBC: 9.3 K/uL (ref 4.0–10.5)
nRBC: 0 % (ref 0.0–0.2)

## 2023-10-21 LAB — MAGNESIUM: Magnesium: 2.6 mg/dL — ABNORMAL HIGH (ref 1.7–2.4)

## 2023-10-21 LAB — ECHOCARDIOGRAM COMPLETE
AR max vel: 2.48 cm2
AV Peak grad: 18.5 mmHg
Ao pk vel: 2.15 m/s
Area-P 1/2: 4.6 cm2
Height: 74 in
S' Lateral: 3 cm
Weight: 3056 [oz_av]

## 2023-10-21 LAB — IRON AND TIBC
Iron: 69 ug/dL (ref 45–182)
Saturation Ratios: 25 % (ref 17.9–39.5)
TIBC: 276 ug/dL (ref 250–450)
UIBC: 207 ug/dL

## 2023-10-21 LAB — BRAIN NATRIURETIC PEPTIDE: B Natriuretic Peptide: 192 pg/mL — ABNORMAL HIGH (ref 0.0–100.0)

## 2023-10-21 LAB — TROPONIN I (HIGH SENSITIVITY)
Troponin I (High Sensitivity): 46 ng/L — ABNORMAL HIGH (ref <18)
Troponin I (High Sensitivity): 48 ng/L — ABNORMAL HIGH (ref <18)

## 2023-10-21 LAB — PHOSPHORUS: Phosphorus: 7.1 mg/dL — ABNORMAL HIGH (ref 2.5–4.6)

## 2023-10-21 MED ORDER — HEPARIN SODIUM (PORCINE) 5000 UNIT/ML IJ SOLN
5000.0000 [IU] | Freq: Three times a day (TID) | INTRAMUSCULAR | Status: DC
  Administered 2023-10-21 – 2023-10-23 (×7): 5000 [IU] via SUBCUTANEOUS
  Filled 2023-10-21 (×8): qty 1

## 2023-10-21 MED ORDER — FUROSEMIDE 10 MG/ML IJ SOLN
40.0000 mg | Freq: Two times a day (BID) | INTRAMUSCULAR | Status: DC
  Administered 2023-10-21 – 2023-10-22 (×3): 40 mg via INTRAVENOUS
  Filled 2023-10-21 (×3): qty 4

## 2023-10-21 MED ORDER — NITROGLYCERIN 2 % TD OINT
1.0000 [in_us] | TOPICAL_OINTMENT | Freq: Once | TRANSDERMAL | Status: AC
  Administered 2023-10-21: 1 [in_us] via TOPICAL
  Filled 2023-10-21: qty 1

## 2023-10-21 MED ORDER — PANTOPRAZOLE SODIUM 40 MG PO TBEC
40.0000 mg | DELAYED_RELEASE_TABLET | Freq: Every day | ORAL | Status: DC
  Administered 2023-10-21 – 2023-10-23 (×3): 40 mg via ORAL
  Filled 2023-10-21 (×4): qty 1

## 2023-10-21 MED ORDER — FLUOXETINE HCL 20 MG PO CAPS
40.0000 mg | ORAL_CAPSULE | Freq: Every day | ORAL | Status: DC
  Administered 2023-10-21 – 2023-10-23 (×3): 40 mg via ORAL
  Filled 2023-10-21 (×3): qty 2

## 2023-10-21 MED ORDER — ACETAMINOPHEN 325 MG PO TABS: 650.0000 mg | ORAL_TABLET | Freq: Four times a day (QID) | ORAL | Status: DC | PRN

## 2023-10-21 MED ORDER — AMLODIPINE BESYLATE 5 MG PO TABS
5.0000 mg | ORAL_TABLET | Freq: Every day | ORAL | Status: DC
  Administered 2023-10-21 – 2023-10-23 (×3): 5 mg via ORAL
  Filled 2023-10-21 (×4): qty 1

## 2023-10-21 MED ORDER — ATORVASTATIN CALCIUM 10 MG PO TABS
10.0000 mg | ORAL_TABLET | Freq: Every day | ORAL | Status: DC
  Administered 2023-10-21 – 2023-10-23 (×3): 10 mg via ORAL
  Filled 2023-10-21 (×3): qty 1

## 2023-10-21 MED ORDER — PROCHLORPERAZINE EDISYLATE 10 MG/2ML IJ SOLN: 10.0000 mg | Freq: Four times a day (QID) | INTRAMUSCULAR | Status: DC | PRN

## 2023-10-21 MED ORDER — TAMSULOSIN HCL 0.4 MG PO CAPS
0.4000 mg | ORAL_CAPSULE | Freq: Every day | ORAL | Status: DC
  Administered 2023-10-21 – 2023-10-23 (×3): 0.4 mg via ORAL
  Filled 2023-10-21 (×3): qty 1

## 2023-10-21 MED ORDER — FUROSEMIDE 10 MG/ML IJ SOLN
40.0000 mg | Freq: Once | INTRAMUSCULAR | Status: AC
  Administered 2023-10-21: 40 mg via INTRAVENOUS
  Filled 2023-10-21: qty 4

## 2023-10-21 MED ORDER — AMLODIPINE BESYLATE 5 MG PO TABS: 5.0000 mg | ORAL_TABLET | Freq: Every day | ORAL | Status: DC

## 2023-10-21 MED ORDER — ARIPIPRAZOLE 10 MG PO TABS
20.0000 mg | ORAL_TABLET | Freq: Every day | ORAL | Status: DC
  Administered 2023-10-21 – 2023-10-23 (×3): 20 mg via ORAL
  Filled 2023-10-21 (×3): qty 2
  Filled 2023-10-21: qty 4

## 2023-10-21 MED ORDER — ACETAMINOPHEN 650 MG RE SUPP: 650.0000 mg | Freq: Four times a day (QID) | RECTAL | Status: DC | PRN

## 2023-10-21 NOTE — ED Triage Notes (Signed)
 Pt bib EMS from home with reports of chest pressure that started a couple of nights ago, when lying down, pt reports pressure is better when he is sitting up, pt had fistula to right arm on June 25th to start dialysis, both feet are swollen, pt reports having a lot of fluid on him. Dr Bari at bedside. Pt reports sob right now and chest pressure of 3/10 -pt sitting upright

## 2023-10-21 NOTE — ED Notes (Signed)
 Lab at bedside

## 2023-10-21 NOTE — ED Notes (Signed)
 Pt given a 2nd urinal just in case he needs it

## 2023-10-21 NOTE — Consult Note (Addendum)
 Reason for Consult:AKI on CKD stage IV Referring Physician: Juvenal, MD  Andre GORMAN Shermon Mickey. is an 73 y.o. male has a PMH significant for DM type 2, HTN, HLD, bipolar disorder, BPH, GERD, OSA, and CKD stage IV-V who presented to [facility] ED via EMS on 10/21/23 c/o 2 day history of chest pressure when lying down, lower extremity edema, and SOB.  In the ED, Temp 98.5, Bp 140/65, HR 87, SpO2 98%.  Labs were notable for Hgb 9.3, Co2 16, BUN 81, Cr 5.35, Ca 9.1, phos 7.1, Mg 2.6, BNP 192, trop I 46.  CXR with emphysematous changes and fibrosis, small bilateral pleural effusions.  He is being admitted for atypical chest pain and new onset CHF.  We were consulted to further evaluate and manage his AKI/CKD stage IV.  The trend in Scr is seen below.  Of note, he is followed by [facility] in [place] and had a failed first stage LUE BVT in early June of this year but it failed and now has a RUE BVT placed 2 weeks ago.  He reports that his eGFR has fallen from 18 mL/min to 14 mL/min over the past several weeks.  He denies any N/V, dysgeusia, anorexia, or malaise.  Trend in Creatinine: Creatinine, Ser  Date/Time Value Ref Range Status  10/21/2023 12:08 AM 5.35 (H) 0.61 - 1.24 mg/dL Final  95/82/7974 90:73 AM 3.96 (H) 0.70 - 1.28 mg/dL   98/96/7974 89:80 AM 7.30 (H) 0.61 - 1.24 mg/dL Final  87/73/7975 91:62 AM 3.03 (H) 0.70 - 1.28 mg/dL   92/84/7975 97:82 PM 7.84 (H) 0.61 - 1.24 mg/dL Final  89/97/7980 87:45 PM 2.51 (H) 0.61 - 1.24 mg/dL Final    PMH:   Past Medical History:  Diagnosis Date   Actinic keratosis    Anemia    Anxiety    Arthritis    Basal cell carcinoma    Back - treated in [place]   Basal cell carcinoma (BCC) 03/18/2022   L forearm - [facility] 04/23/2022   Basal cell carcinoma (BCC) 03/18/2022   R forearm - [facility] 04/23/2022   Bipolar 1 disorder (HCC)    Chronic renal insufficiency    GFR 23   Depression    Diabetes (HCC)    Diabetes mellitus, type II (HCC)    GERD  (gastroesophageal reflux disease)    Gout    Hypertension    Sleep apnea    cannot tolerate CPAP    PSH:   Past Surgical History:  Procedure Laterality Date   BACK SURGERY  2006   COLONOSCOPY WITH PROPOFOL  N/A 09/07/2018   Procedure: COLONOSCOPY WITH PROPOFOL ;  Surgeon: Toledo, Ladell POUR, MD;  Location: [facility] ENDOSCOPY;  Service: Gastroenterology;  Laterality: N/A;   ESOPHAGOGASTRODUODENOSCOPY (EGD) WITH PROPOFOL  N/A 09/07/2018   Procedure: ESOPHAGOGASTRODUODENOSCOPY (EGD) WITH PROPOFOL ;  Surgeon: Toledo, Ladell POUR, MD;  Location: [facility] ENDOSCOPY;  Service: Gastroenterology;  Laterality: N/A;   GASTRIC BYPASS  2008   INGUINAL HERNIA REPAIR     age 49   LAPAROSCOPY N/A 11/02/2022   Procedure: DIAGNOSTIC LAPAROSCOPY;  Surgeon: Tanda Locus, MD;  Location: [facility] ORS;  Service: General;  Laterality: N/A;   REVISION OF TOTAL SHOULDER  2017   soft palate reduction     TOTAL SHOULDER REPLACEMENT Right 2017   TRANSURETHRAL RESECTION OF PROSTATE     X 2    Allergies:  Allergies  Allergen Reactions   Iodinated Contrast Media Hives and Itching   Oxytetracycline Shortness Of Breath and  Rash    TERRAMYCIN - Respiratory arrest   Ropinirole      Confusion (intolerance), Memory impairment    Silicone Hives and Rash   Tape Hives and Rash   Amoxicillin Nausea Only   Tetracycline     Respiratory arrest (Terramycin)    Nsaids Itching, Nausea And Vomiting and Other (See Comments)    Hx gastric bypass can not take nsaids    Medications:   Prior to Admission medications   Medication Sig Start Date End Date Taking? Authorizing Provider  acetaminophen  (TYLENOL ) 500 MG tablet Take 1,000 mg by mouth every 6 (six) hours as needed for moderate pain (pain score 4-6).   Yes [provider]  amLODipine  (NORVASC ) 5 MG tablet Take 5 mg by mouth daily. 05/13/22  Yes [provider]  ARIPiprazole  (ABILIFY ) 20 MG tablet Take 20 mg by mouth daily. 05/21/22  Yes [provider]   atorvastatin  (LIPITOR) 10 MG tablet Take 10 mg by mouth daily. 09/09/22  Yes [provider]  calcitRIOL (ROCALTROL) 0.5 MCG capsule Take 0.5 mcg by mouth daily. 12/15/22  Yes [provider]  FLUoxetine  (PROZAC ) 40 MG capsule Take 40 mg by mouth daily. 05/21/22  Yes [provider]  hydrALAZINE (APRESOLINE) 25 MG tablet Take 25 mg by mouth 3 (three) times daily. 06/10/23 02/23/25 Yes [provider]  loratadine (CLARITIN) 10 MG tablet Take 10 mg by mouth daily. 09/09/23  Yes [provider]  pantoprazole  (PROTONIX ) 40 MG tablet Take 40 mg by mouth daily. 04/22/23  Yes [provider]  tamsulosin  (FLOMAX ) 0.4 MG CAPS capsule Take 0.8 mg by mouth daily.   Yes [provider]  furosemide  (LASIX ) 20 MG tablet Take 20 mg by mouth daily.    [provider]    Inpatient medications:  amLODipine   5 mg Oral Daily   ARIPiprazole   20 mg Oral Daily   atorvastatin   10 mg Oral Daily   FLUoxetine   40 mg Oral Daily   furosemide   40 mg Intravenous Q12H   heparin   5,000 Units Subcutaneous Q8H   pantoprazole   40 mg Oral Daily   tamsulosin   0.4 mg Oral Daily    Discontinued Meds:   Medications Discontinued During This Encounter  Medication Reason   albuterol  (VENTOLIN  HFA) 108 (90 Base) MCG/ACT inhaler Completed Course   ARIPiprazole  (ABILIFY ) 10 MG tablet Change in therapy   cetirizine (ZYRTEC) 5 MG tablet Change in therapy   chlorhexidine  (PERIDEX ) 0.12 % solution Completed Course   FLUoxetine  (PROZAC ) 20 MG capsule Change in therapy   HYDROcodone-acetaminophen  (NORCO/VICODIN) 5-325 MG tablet Completed Course   methocarbamol (ROBAXIN) 750 MG tablet Completed Course   traMADol  (ULTRAM ) 50 MG tablet Completed Course   amLODipine  (NORVASC ) tablet 5 mg     Social History:  reports that he quit smoking about 26 years ago. His smoking use included cigarettes. He started smoking about 58 years ago. He has a 32 pack-year smoking history. He  has never used smokeless tobacco. He reports current alcohol use. He reports current drug use. Drug: Marijuana.  Family History:   Family History  Problem Relation Age of Onset   Rheum arthritis Mother    Diabetes Mother    Heart disease Father    Colon cancer Neg Hx     Pertinent items are noted in HPI. Weight change:   Intake/Output Summary (Last 24 hours) at 10/21/2023 1055 Last data filed at 10/21/2023 0918 Gross per 24 hour  Intake --  Output  1525 ml  Net -1525 ml   BP (!) 145/68   Pulse 87   Temp 98.1 F (36.7 C) (Oral)   Resp 14   Ht 6' 2 (1.88 m)   Wt 86.6 kg   SpO2 98%   BMI 24.52 kg/m  Vitals:   10/21/23 0800 10/21/23 0830 10/21/23 0900 10/21/23 0930  BP: (!) 151/68 (!) 152/65 (!) 150/64 (!) 145/68  Pulse: 94 94 93 87  Resp: 19 (!) 22 17 14   Temp:      TempSrc:      SpO2: 96% 95% 97% 98%  Weight:      Height:         General appearance: alert, cooperative, and no distress Head: Normocephalic, without obvious abnormality, atraumatic Resp: clear to auscultation bilaterally Cardio: regular rate and rhythm, S1, S2 normal, no murmur, click, rub or gallop GI: soft, non-tender; bowel sounds normal; no masses,  no organomegaly Extremities: edema trace pretibial edema bilaterally and RUE AVF +T/B  Labs: Basic Metabolic Panel: Recent Labs  Lab 10/21/23 0008 10/21/23 0200  NA 136  --   K 3.9  --   CL 106  --   CO2 16*  --   GLUCOSE 108*  --   BUN 81*  --   CREATININE 5.35*  --   CALCIUM  9.1  --   PHOS  --  7.1*   Liver Function Tests: No results for input(s): AST, ALT, ALKPHOS, BILITOT, PROT, ALBUMIN in the last 168 hours. No results for input(s): LIPASE, AMYLASE in the last 168 hours. No results for input(s): AMMONIA in the last 168 hours. CBC: Recent Labs  Lab 10/21/23 0008  WBC 9.3  NEUTROABS 6.9  HGB 9.3*  HCT 27.8*  MCV 97.2  PLT 291   PT/INR: @LABRCNTIP (inr:5) Cardiac Enzymes: )No results for input(s): CKTOTAL,  CKMB, CKMBINDEX, TROPONINI in the last 168 hours. CBG: No results for input(s): GLUCAP in the last 168 hours.  Iron Studies: No results for input(s): IRON, TIBC, TRANSFERRIN, FERRITIN in the last 168 hours.  Xrays/Other Studies: DG Chest Portable 1 View Result Date: 10/21/2023 CLINICAL DATA:  Chest pain and unusual pressure. EXAM: PORTABLE CHEST 1 VIEW COMPARISON:  04/16/2023 FINDINGS: Heart size and pulmonary vascularity are normal for technique. Emphysematous changes in the lungs. Diffuse interstitial pattern suggesting fibrosis. Peribronchial thickening and streaky perihilar opacities suggesting bronchitis or airways disease. Interstitial changes are somewhat more prominent than on prior study which could indicate mild superimposed edema or acute airways disease. Small bilateral pleural effusions. No pneumothorax. Mediastinal contours appear intact. Postoperative changes in the right shoulder. IMPRESSION: 1. Emphysematous changes and chronic bronchitic changes and fibrosis in the lungs. 2. Interstitial changes are somewhat increased which may indicate developing interstitial process since prior study. Small bilateral pleural effusions. Electronically Signed   By: Elsie Gravely M.D.   On: 10/21/2023 00:26     Assessment/Plan:  AKI/CKD stage IV-V  vs progressive CKD now at stage V - he is without uremic symptoms.  AKI/CKD could be related to acute CHF.  Continue with IV lasix  and follow UOP and Scr with diuresis.  No urgent indication for RRT at this time, however he is nearing the need to start if his BUN/Cr continue to climb.   Avoid nephrotoxic medications including NSAIDs and iodinated intravenous contrast exposure unless the latter is absolutely indicated.   Preferred narcotic agents for pain control are hydromorphone , fentanyl , and methadone. Morphine should not be used.  Avoid Baclofen and avoid oral sodium phosphate   and magnesium citrate based laxatives / bowel preps.   Continue strict Input and Output monitoring.  Will monitor the patient closely with you and intervene or adjust therapy as indicated by changes in clinical status/labs  No blood draws or Bp in right arm Chest pressure - cardiology consulted  New onset CHF - agree that it is likely due to worsening renal function.  Cardiology following and ECHO pending. HTN - continue with home regimen for now and follow with diuresis. LBBB - per Cardiology Anemia of CKD stage IV-V - will check iron stores and start ESA CKD-BMD - phos elevated and iPTH was 134 7 months ago.  Will start binders and follow.    [employer] LABOR Damont Balles 10/21/2023, 10:55 AM

## 2023-10-21 NOTE — ED Provider Notes (Signed)
 [place] EMERGENCY DEPARTMENT AT [facility] Provider Note   CSN: [id number] Arrival date & time: 10/20/23  2358     Patient presents with: Chest Pain   Bryan Manning. is a 73 y.o. male.   HPI     This is a 73 year old male with history of chronic kidney disease who presents with chest pain and shortness of breath.  Patient reports that he had difficulty going to sleep because he felt pressure on his chest and difficulty breathing.  He feels like he is volume overloaded.  Currently he is not having any active chest pressure.  Denies any history of heart disease but does have chronic kidney disease and had a fistula placed in the right upper extremity.  Not currently on dialysis.  Denies fevers or cough.  Does endorse lower extremity swelling.  Increasing over the last several days.  Is on a stable dose of Lasix .  States he takes 10 mg but but does not believe he is peeing sufficiently.  Prior to Admission medications   Medication Sig Start Date End Date Taking? Authorizing Provider  amLODipine  (NORVASC ) 5 MG tablet Take 5 mg by mouth daily. 05/13/22   [provider]  ARIPiprazole  (ABILIFY ) 20 MG tablet Take 20 mg by mouth daily. 05/21/22   [provider]  atorvastatin  (LIPITOR) 10 MG tablet Take 10 mg by mouth daily. 09/09/22   [provider]  FLUoxetine  (PROZAC ) 40 MG capsule Take 40 mg by mouth daily. 05/21/22   [provider]  tamsulosin  (FLOMAX ) 0.4 MG CAPS capsule Take 0.4 mg by mouth daily.    [provider]    Allergies: Iodinated contrast media, Oxytetracycline, Ropinirole, Silicone, Tape, Amoxicillin, Tetracycline, and Nsaids    Review of Systems  Constitutional:  Negative for fever.  Respiratory:  Positive for shortness of breath. Negative for cough.   Cardiovascular:  Positive for chest pain and leg swelling.  All other systems reviewed and are negative.   Updated Vital Signs BP (!) 140/65   Pulse 87   Temp  98.5 F (36.9 C) (Tympanic)   Resp 13   Ht 1.88 m (6' 2)   Wt 86.6 kg   SpO2 98%   BMI 24.52 kg/m   Physical Exam Vitals and nursing note reviewed.  Constitutional:      Appearance: He is well-developed. He is ill-appearing. He is not toxic-appearing.  HENT:     Head: Normocephalic and atraumatic.  Eyes:     Pupils: Pupils are equal, round, and reactive to light.  Cardiovascular:     Rate and Rhythm: Regular rhythm. Tachycardia present.     Heart sounds: Normal heart sounds. No murmur heard. Pulmonary:     Effort: Pulmonary effort is normal. No respiratory distress.     Breath sounds: No wheezing.     Comments: Fine crackles at the bases Abdominal:     General: Bowel sounds are normal.     Palpations: Abdomen is soft.     Tenderness: There is no abdominal tenderness. There is no rebound.  Musculoskeletal:     Cervical back: Neck supple.     Right lower leg: Edema present.     Left lower leg: Edema present.     Comments: 2+ pitting edema  Lymphadenopathy:     Cervical: No cervical adenopathy.  Skin:    General: Skin is warm and dry.  Neurological:     Mental Status: He is alert and oriented to person, place, and time.  Psychiatric:        Mood and Affect: Mood normal.     (all labs ordered are listed, but only abnormal results are displayed) Labs Reviewed  CBC WITH DIFFERENTIAL/PLATELET - Abnormal; Notable for the following components:      Result Value   RBC 2.86 (*)    Hemoglobin 9.3 (*)    HCT 27.8 (*)    All other components within normal limits  BASIC METABOLIC PANEL WITH GFR - Abnormal; Notable for the following components:   CO2 16 (*)    Glucose, Bld 108 (*)    BUN 81 (*)    Creatinine, Ser 5.35 (*)    GFR, Estimated 11 (*)    All other components within normal limits  BRAIN NATRIURETIC PEPTIDE - Abnormal; Notable for the following components:   B Natriuretic Peptide 192.0 (*)    All other components within normal limits  TROPONIN I (HIGH  SENSITIVITY) - Abnormal; Notable for the following components:   Troponin I (High Sensitivity) 48 (*)    All other components within normal limits  TROPONIN I (HIGH SENSITIVITY) - Abnormal; Notable for the following components:   Troponin I (High Sensitivity) 46 (*)    All other components within normal limits    EKG: EKG Interpretation Date/Time:  Thursday October 21 2023 00:09:19 EDT Ventricular Rate:  103 PR Interval:  161 QRS Duration:  147 QT Interval:  390 QTC Calculation: 511 R Axis:   111  Text Interpretation: Sinus tachycardia Probable left atrial enlargement Nonspecific intraventricular conduction delay Faster but similar to July 2024 Confirmed by Bari Pfeiffer ([id number]) on 10/21/2023 12:21:40 AM   EKG Interpretation Date/Time:  Thursday October 21 2023 00:17:58 EDT Ventricular Rate:  99 PR Interval:  156 QRS Duration:  153 QT Interval:  417 QTC Calculation: 536 R Axis:   98  Text Interpretation: Sinus rhythm Probable left atrial enlargement Left ventricular hypertrophy Anterior Q waves, possibly due to LVH Prolonged QT interval no dynamic changes Confirmed by Bari Pfeiffer ([id number]) on 10/21/2023 3:08:08 AM        Radiology: [facility] Chest Portable 1 View Result Date: 10/21/2023 CLINICAL DATA:  Chest pain and unusual pressure. EXAM: PORTABLE CHEST 1 VIEW COMPARISON:  04/16/2023 FINDINGS: Heart size and pulmonary vascularity are normal for technique. Emphysematous changes in the lungs. Diffuse interstitial pattern suggesting fibrosis. Peribronchial thickening and streaky perihilar opacities suggesting bronchitis or airways disease. Interstitial changes are somewhat more prominent than on prior study which could indicate mild superimposed edema or acute airways disease. Small bilateral pleural effusions. No pneumothorax. Mediastinal contours appear intact. Postoperative changes in the right shoulder. IMPRESSION: 1. Emphysematous changes and chronic bronchitic changes and fibrosis in  the lungs. 2. Interstitial changes are somewhat increased which may indicate developing interstitial process since prior study. Small bilateral pleural effusions. Electronically Signed   By: Elsie Gravely M.D.   On: 10/21/2023 00:26     .Critical Care  Performed by: Bari Pfeiffer FALCON, MD Authorized by: Bari Pfeiffer FALCON, MD   Critical care provider statement:    Critical care time (minutes):  35   Critical care was necessary to treat or prevent imminent or life-threatening deterioration of the following conditions:  Renal failure and cardiac failure   Critical care was time spent personally by me on the following activities:  Development of treatment plan with patient or surrogate, discussions with consultants, evaluation of patient's response to treatment, examination of patient, ordering and review of laboratory studies, ordering and review of radiographic  studies, ordering and performing treatments and interventions, pulse oximetry, re-evaluation of patient's condition and review of old charts    Medications Ordered in the ED  furosemide  (LASIX ) injection 40 mg (has no administration in time range)  nitroGLYCERIN  (NITROGLYN) 2 % ointment 1 inch (1 inch Topical Given 10/21/23 0024)                                    Medical Decision Making Amount and/or Complexity of Data Reviewed Labs: ordered. Radiology: ordered.  Risk Prescription drug management. Decision regarding hospitalization.   This patient presents to the ED for concern of shortness of breath, chest pain, lower extremity swelling, this involves an extensive number of treatment options, and is a complaint that carries with it a high risk of complications and morbidity.  I considered the following differential and admission for this acute, potentially life threatening condition.  The differential diagnosis includes CHF, worsening renal function, ACS, pneumothorax, PE  MDM:    This is a 73 year old male who presents  with concern for chest pain and shortness of breath.  He is nontoxic-appearing.  Clinical history and physical exam suggestive of volume overload versus CHF versus worsening renal failure.  EKG shows left bundle branch block and tachycardia.  There are some discordant changes but does not meet Sgarbossa criteria and likely rate related.  Repeat EKG without dynamic changes.  Troponin 48 and 46 respectively.  Essentially flat.  This is also in the setting of a creatinine of 5.35.  Most recent baseline high threes.  Follows with nephrology in [place].  Chest x-ray does show interstitial lung changes and pleural effusions.  Patient was given 1 dose of 40 mg of IV Lasix .  O2 sats remained stable.  Feel he would benefit from admission for diuresis and nephrology consultation.  Also likely would benefit from echocardiogram to evaluate for CHF or pericardial effusion related to uremia.  Patient is agreeable to plan.  (Labs, imaging, consults)  Labs: I Ordered, and personally interpreted labs.  The pertinent results include: CBC, BMP, BNP, troponin x 2  Imaging Studies ordered: I ordered imaging studies including x-ray I independently visualized and interpreted imaging. I agree with the radiologist interpretation  Additional history obtained from chart reviewed.  External records from outside source obtained and reviewed including outside nephrology notes  Cardiac Monitoring: The patient was maintained on a cardiac monitor.  If on the cardiac monitor, I personally viewed and interpreted the cardiac monitored which showed an underlying rhythm of: Sinus  Reevaluation: After the interventions noted above, I reevaluated the patient and found that they have :improved  Social Determinants of Health:  lives independently  Disposition: Admit  Co morbidities that complicate the patient evaluation  Past Medical History:  Diagnosis Date   Actinic keratosis    Anemia    Anxiety    Arthritis    Basal  cell carcinoma    Back - treated in [place]   Basal cell carcinoma (BCC) 03/18/2022   L forearm - [facility] 04/23/2022   Basal cell carcinoma (BCC) 03/18/2022   R forearm - [facility] 04/23/2022   Bipolar 1 disorder (HCC)    Chronic renal insufficiency    GFR 23   Depression    Diabetes (HCC)    Diabetes mellitus, type II (HCC)    GERD (gastroesophageal reflux disease)    Gout    Hypertension    Sleep apnea  cannot tolerate CPAP     Medicines Meds ordered this encounter  Medications   nitroGLYCERIN  (NITROGLYN) 2 % ointment 1 inch   furosemide  (LASIX ) injection 40 mg    I have reviewed the patients home medicines and have made adjustments as needed  Problem List / ED Course: Problem List Items Addressed This Visit   None Visit Diagnoses       Atypical chest pain    -  Primary     Acute renal failure superimposed on stage 5 chronic kidney disease, not on chronic dialysis, unspecified acute renal failure type (HCC)         Other hypervolemia                    Final diagnoses:  Atypical chest pain  Acute renal failure superimposed on stage 5 chronic kidney disease, not on chronic dialysis, unspecified acute renal failure type (HCC)  Other hypervolemia    ED Discharge Orders     None          Bari Charmaine FALCON, MD 10/21/23 [phone]

## 2023-10-21 NOTE — ED Notes (Signed)
 Provider at bedside

## 2023-10-21 NOTE — Progress Notes (Signed)
   10/21/23 1434  TOC Brief Assessment  Insurance and Status Reviewed  Patient has primary care physician Yes  Home environment has been reviewed Home w/ spouse  Prior level of function: Independent  Prior/Current Home Services No current home services  Social Drivers of Health Review SDOH reviewed no interventions necessary  Readmission risk has been reviewed Yes  Transition of care needs no transition of care needs at this time

## 2023-10-21 NOTE — Consult Note (Addendum)
 Cardiology Consultation   Patient ID: Bryan Manning. MRN: [id number]; DOB: 06/17/1950  Admit date: 10/21/2023 Date of Consult: 10/21/2023  PCP:  Myra Geni ORN, FNP   [facility] Providers Cardiologist:  New to [facility]   Patient Profile: Bryan Manning. is a 73 y.o. male with a hx of HTN, HLD, Stage 4-5 CKD, GERD and OSA (intolerant to CPAP) who is being seen 10/21/2023 for the evaluation of atypical chest pain and elevated BNP at the request of Dr. Manfred.  History of Present Illness:  Mr. Bryan Manning presented to [facility] ED this morning for evaluation of chest pain. Also reported worsening shortness of breath and lower extremity edema. In talking with the patient and his sister today, he reports having worsening dyspnea on exertion for the past few weeks. Starting approximately 3 to 4 days ago, he developed worsening orthopnea and PND. Says he was having to sleep in his recliner at night to help with symptoms. Reports chest discomfort would occur at night when he could not catch his breath. No recent exertional chest pain. Has also developed worsening lower extremity edema. Says that his weight was at approximately 180 lbs 3 to 4 months ago and has progressively increased, now at 191 lbs. Says that he has been continued on Lasix  20 mg daily by Nephrology and this has not been adjusted recently. He did undergo fistula placement last month as his initial fistula failed. He denies any known history of CAD, CHF or cardiac arrhythmias. Says that he had a heart catheterization approximately 20 years ago which was normal. Reports his dad was diagnosed with CHF in his 3's. The patient is a former smoker but quit in 1998. Denies any alcohol use or recreational drug use.  Initial labs showed WBC 9.3, Hgb 9.3, platelets 291, Na+ 136, K+ 3.9 and creatinine 5.35 (previously 3.96 in 07/2023).  BNP mildly elevated at 192. Initial and repeat hs Troponin values at 48 and 46.  CXR shows  emphysematous changes and chronic bronchitic changes along with fibrosis. Also noted to have small bilateral pleural effusions  EKG shows sinus tachycardia, HR 103 with known LBBB (dating back to at least 2019 by review of prior tracings).   He received nitroglycerin  ointment and received 40 mg IV Lasix . Has been started on 40 mg twice daily. Reports multiple urine occurrences since. Continued on PTA Atorvastatin  10 mg daily.  Past Medical History:  Diagnosis Date   Actinic keratosis    Anemia    Anxiety    Arthritis    Basal cell carcinoma    Back - treated in [place]   Basal cell carcinoma (BCC) 03/18/2022   L forearm - [facility] 04/23/2022   Basal cell carcinoma (BCC) 03/18/2022   R forearm - [facility] 04/23/2022   Bipolar 1 disorder (HCC)    Chronic renal insufficiency    GFR 23   Depression    Diabetes (HCC)    Diabetes mellitus, type II (HCC)    GERD (gastroesophageal reflux disease)    Gout    Hypertension    Sleep apnea    cannot tolerate CPAP    Past Surgical History:  Procedure Laterality Date   BACK SURGERY  2006   COLONOSCOPY WITH PROPOFOL  N/A 09/07/2018   Procedure: COLONOSCOPY WITH PROPOFOL ;  Surgeon: Toledo, Ladell POUR, MD;  Location: [facility] ENDOSCOPY;  Service: Gastroenterology;  Laterality: N/A;   ESOPHAGOGASTRODUODENOSCOPY (EGD) WITH PROPOFOL  N/A 09/07/2018   Procedure: ESOPHAGOGASTRODUODENOSCOPY (EGD) WITH PROPOFOL ;  Surgeon: [place], Teodoro  K, MD;  Location: [facility] ENDOSCOPY;  Service: Gastroenterology;  Laterality: N/A;   GASTRIC BYPASS  2008   INGUINAL HERNIA REPAIR     age 8   LAPAROSCOPY N/A 11/02/2022   Procedure: DIAGNOSTIC LAPAROSCOPY;  Surgeon: Tanda Locus, MD;  Location: [facility] ORS;  Service: General;  Laterality: N/A;   REVISION OF TOTAL SHOULDER  2017   soft palate reduction     TOTAL SHOULDER REPLACEMENT Right 2017   TRANSURETHRAL RESECTION OF PROSTATE     X 2     Home Medications:  Prior to Admission medications   Medication Sig Start Date End Date  Taking? Authorizing Provider  acetaminophen  (TYLENOL ) 500 MG tablet Take 1,000 mg by mouth every 6 (six) hours as needed for moderate pain (pain score 4-6).   Yes [provider]  amLODipine  (NORVASC ) 5 MG tablet Take 5 mg by mouth daily. 05/13/22  Yes [provider]  ARIPiprazole  (ABILIFY ) 20 MG tablet Take 20 mg by mouth daily. 05/21/22  Yes [provider]  atorvastatin  (LIPITOR) 10 MG tablet Take 10 mg by mouth daily. 09/09/22  Yes [provider]  calcitRIOL (ROCALTROL) 0.5 MCG capsule Take 0.5 mcg by mouth daily. 12/15/22  Yes [provider]  FLUoxetine  (PROZAC ) 40 MG capsule Take 40 mg by mouth daily. 05/21/22  Yes [provider]  hydrALAZINE (APRESOLINE) 25 MG tablet Take 25 mg by mouth 3 (three) times daily. 06/10/23 02/23/25 Yes [provider]  loratadine (CLARITIN) 10 MG tablet Take 10 mg by mouth daily. 09/09/23  Yes [provider]  pantoprazole  (PROTONIX ) 40 MG tablet Take 40 mg by mouth daily. 04/22/23  Yes [provider]  tamsulosin  (FLOMAX ) 0.4 MG CAPS capsule Take 0.8 mg by mouth daily.   Yes [provider]  furosemide  (LASIX ) 20 MG tablet Take 20 mg by mouth daily.    [provider]    Scheduled Meds:  atorvastatin   10 mg Oral Daily   FLUoxetine   40 mg Oral Daily   furosemide   40 mg Intravenous Q12H   heparin   5,000 Units Subcutaneous Q8H   tamsulosin   0.4 mg Oral Daily   Continuous Infusions:  PRN Meds: acetaminophen  **OR** acetaminophen , prochlorperazine   Allergies:    Allergies  Allergen Reactions   Iodinated Contrast Media Hives and Itching   Oxytetracycline Shortness Of Breath and Rash    TERRAMYCIN - Respiratory arrest   Ropinirole      Confusion (intolerance), Memory impairment    Silicone Hives and Rash   Tape Hives and Rash   Amoxicillin Nausea Only   Tetracycline     Respiratory arrest (Terramycin)    Nsaids Itching, Nausea And Vomiting and Other (See  Comments)    Hx gastric bypass can not take nsaids    Social History:   Social History   Socioeconomic History   Marital status: Married    Spouse name: Not on file   Number of children: Not on file   Years of education: Not on file   Highest education level: Not on file  Occupational History   Not on file  Tobacco Use   Smoking status: Former    Current packs/day: 0.00    Average packs/day: 1 pack/day for 32.0 years (32.0 ttl pk-yrs)    Types: Cigarettes    Start date: 01/12/1965    Quit date: 01/12/1997    Years since quitting: 26.7   Smokeless tobacco: Never  Vaping Use   Vaping status: Never Used  Substance and  Sexual Activity   Alcohol use: Yes    Comment: occas   Drug use: Yes    Types: Marijuana   Sexual activity: Yes    Birth control/protection: None  Other Topics Concern   Not on file  Social History Narrative   Not on file    Family History:    Family History  Problem Relation Age of Onset   Rheum arthritis Mother    Diabetes Mother    Heart disease Father    Colon cancer Neg Hx      ROS:  Please see the history of present illness.   All other ROS reviewed and negative.     Physical Exam/Data: Vitals:   10/21/23 0500 10/21/23 0600 10/21/23 0616 10/21/23 0630  BP: (!) 151/66 136/61  (!) 151/68  Pulse: 93 91  93  Resp: 15 16  18   Temp:   98.1 F (36.7 C)   TempSrc:   Oral   SpO2: 97% 95%  96%  Weight:      Height:        Intake/Output Summary (Last 24 hours) at 10/21/2023 0903 Last data filed at 10/21/2023 0617 Gross per 24 hour  Intake --  Output 850 ml  Net -850 ml      10/21/2023   12:22 AM 04/16/2023    9:57 AM 11/02/2022    5:30 AM  Last 3 Weights  Weight (lbs) 191 lb 182 lb 185 lb  Weight (kg) 86.637 kg 82.555 kg 83.915 kg     Body mass index is 24.52 kg/m.  General:  Well nourished, well developed male appearing in no acute distress HEENT: normal Neck: no JVD Vascular: No carotid bruits; Distal pulses 2+  bilaterally Cardiac:  normal S1, S2; RRR; 2/6 continuous murmur.  Lungs: rales along bases bilaterally. No wheezing.  Abd: soft, nontender, no hepatomegaly  Ext: 2+ pitting edema bilaterally.  Musculoskeletal:  No deformities, BUE and BLE strength normal and equal Skin: warm and dry  Neuro:  CNs 2-12 intact, no focal abnormalities noted Psych:  Normal affect   EKG:  The EKG was personally reviewed and demonstrates: Sinus tachycardia, HR 103 with known LBBB (dating back to at least 2019 by review of prior tracings).  Telemetry:  Telemetry was personally reviewed and demonstrates: NSR, HR in 80's to 90's.   Relevant CV Studies:  Echocardiogram: Pending  Laboratory Data: High Sensitivity Troponin:   Recent Labs  Lab 10/21/23 0008 10/21/23 0200  TROPONINIHS 48* 46*     Chemistry Recent Labs  Lab 10/21/23 0008 10/21/23 0200  NA 136  --   K 3.9  --   CL 106  --   CO2 16*  --   GLUCOSE 108*  --   BUN 81*  --   CREATININE 5.35*  --   CALCIUM  9.1  --   MG  --  2.6*  GFRNONAA 11*  --   ANIONGAP 14  --     No results for input(s): PROT, ALBUMIN, AST, ALT, ALKPHOS, BILITOT in the last 168 hours. Lipids No results for input(s): CHOL, TRIG, HDL, LABVLDL, LDLCALC, CHOLHDL in the last 168 hours.  Hematology Recent Labs  Lab 10/21/23 0008  WBC 9.3  RBC 2.86*  HGB 9.3*  HCT 27.8*  MCV 97.2  MCH 32.5  MCHC 33.5  RDW 13.2  PLT 291   Thyroid  No results for input(s): TSH, FREET4 in the last 168 hours.  BNP Recent Labs  Lab 10/21/23 0018  BNP 192.0*  DDimer No results for input(s): DDIMER in the last 168 hours.  Radiology/Studies:  DG Chest Portable 1 View Result Date: 10/21/2023 CLINICAL DATA:  Chest pain and unusual pressure. EXAM: PORTABLE CHEST 1 VIEW COMPARISON:  04/16/2023 FINDINGS: Heart size and pulmonary vascularity are normal for technique. Emphysematous changes in the lungs. Diffuse interstitial pattern suggesting fibrosis.  Peribronchial thickening and streaky perihilar opacities suggesting bronchitis or airways disease. Interstitial changes are somewhat more prominent than on prior study which could indicate mild superimposed edema or acute airways disease. Small bilateral pleural effusions. No pneumothorax. Mediastinal contours appear intact. Postoperative changes in the right shoulder. IMPRESSION: 1. Emphysematous changes and chronic bronchitic changes and fibrosis in the lungs. 2. Interstitial changes are somewhat increased which may indicate developing interstitial process since prior study. Small bilateral pleural effusions. Electronically Signed   By: Elsie Gravely M.D.   On: 10/21/2023 00:26     Assessment and Plan:  1. Volume Overload/Acute on Chronic Stage 5 CKD - Presents with worsening dyspnea on exertion, orthopnea, PND and pitting edema and clearly volume overloaded on examination today. Creatinine was at 3.96 in 07/2023 and up to 5.35 on admission. BNP mildly elevated at 192.  - He has received IV Lasix  40 mg x 2 and is reporting good urine output with this. Will defer diuretic management to Nephrology in the setting of Stage V CKD. An echocardiogram is pending to assess for any structural abnormalities (does have a murmur on examination but likely due to his fistula). If his echo is reassuring, would not anticipate further cardiac workup at this time.  2. Atypical Chest Pain/Elevated Troponin Values - Hs Troponin Values have been mildly elevated at 48 and 46 which is likely secondary to demand ischemia in the setting of fluid overload and CKD. His episodes of chest pain are overall atypical for a cardiac etiology as they occur at rest and are typically at night when he has difficulty catching his breath. No chest pain with exertion. - Echocardiogram is pending to assess for any structural abnormalities. Continue with risk factor modification. He would not be a candidate for invasive ischemic evaluation  given his CKD.  3. HTN - BP was at 151/68 on most recent check. He is listed as being on Amlodipine  5 mg daily and Hydralazine 25 mg 3 times daily at home. Will go ahead and resume Amlodipine  now.   4. HLD - He has been continued on Atorvastatin  10 mg daily.  5. LBBB - This has been noted on prior EKG tracings dated back to at least 2019. He reports having a heart catheterization 20+ year ago due to an abnormality on his EKG and he is unsure if this was due to T-wave abnormalities or LBBB. - QT read as prolonged but this is due to his LBBB. Once corrected, QTc is manually calculated at 438 ms.    For questions or updates, please contact  [facility] Please consult [email or web address] for contact info under    Signed, Laymon CHRISTELLA Qua, PA-C  10/21/2023 9:03 AM

## 2023-10-21 NOTE — ED Notes (Signed)
 Pt asked this RN to change the dressing to his fistula site on right upper arm. Site has clear drainage that has soaked through current bandage.

## 2023-10-21 NOTE — Progress Notes (Signed)
 Patient admitted after midnight, please see H&P. Seen by cardiology and no plan for further cardiac work up pending echo Await renal eval-- currently lasix  ordered BID IV 40 mg  Kenyia Wambolt DO

## 2023-10-21 NOTE — ED Notes (Signed)
 Echo at bedside

## 2023-10-21 NOTE — H&P (Addendum)
 History and Physical    Patient: Bryan Manning. FMW:[id number] DOB: 14-Jul-1950 DOA: 10/21/2023 DOS: the patient was seen and examined on 10/21/2023 PCP: Myra Geni ORN, FNP  Patient coming from: Home  Chief Complaint:  Chief Complaint  Patient presents with   Chest Pain   HPI: Bryan Lightsey. is a 73 y.o. male with medical history significant of hypertension, hyperlipidemia, depression, CKD stage 4, BPH who presents to the emergency department due to chest pain and shortness of breath.  He complained of difficulty in laying flat in bed due to worsening shortness of breath and increased chest pressure which improves on sitting up.  He also complained of shortness of breath on minimal exertion.  He complained of several weeks of worsening bilateral leg swelling despite being on Lasix  10 mg daily.  He endorsed a history of CKD with a right upper extremity fistula, but he has not started dialysis.  He looked up his symptoms on [facility] and it was suggested he may be having CHF and was advised to go to ED for further evaluation.  ED Course: In the emergency department, BP was 145/54, other vital signs were within normal range.  Workup in ED showed normocytic anemia, BMP was normal except for bicarb of 16 and blood glucose of 100, BUN/creatinine 81/5.35 (baseline at 2.2-2.7).  BNP 192, troponin 48 > 46. Chest x-ray showed emphysematous  changes and chronic bronchitic changes and fibrosis in the lungs. Interstitial changes are somewhat increased which may indicate developing interstitial process since prior study. Small bilateral pleural effusions. Nitroglycerin  ointment was given, IV Lasix  40 mg x 1 was given.  TRH was asked to admit patient  Review of Systems: Review of systems as noted in the HPI. All other systems reviewed and are negative.   Past Medical History:  Diagnosis Date   Actinic keratosis    Anemia    Anxiety    Arthritis    Basal cell carcinoma    Back - treated in  [place]   Basal cell carcinoma (BCC) 03/18/2022   L forearm - [facility] 04/23/2022   Basal cell carcinoma (BCC) 03/18/2022   R forearm - [facility] 04/23/2022   Bipolar 1 disorder (HCC)    Chronic renal insufficiency    GFR 23   Depression    Diabetes (HCC)    Diabetes mellitus, type II (HCC)    GERD (gastroesophageal reflux disease)    Gout    Hypertension    Sleep apnea    cannot tolerate CPAP   Past Surgical History:  Procedure Laterality Date   BACK SURGERY  2006   COLONOSCOPY WITH PROPOFOL  N/A 09/07/2018   Procedure: COLONOSCOPY WITH PROPOFOL ;  Surgeon: Toledo, Ladell POUR, MD;  Location: [facility];  Service: Gastroenterology;  Laterality: N/A;   ESOPHAGOGASTRODUODENOSCOPY (EGD) WITH PROPOFOL  N/A 09/07/2018   Procedure: ESOPHAGOGASTRODUODENOSCOPY (EGD) WITH PROPOFOL ;  Surgeon: Toledo, Ladell POUR, MD;  Location: [facility] ENDOSCOPY;  Service: Gastroenterology;  Laterality: N/A;   GASTRIC BYPASS  2008   INGUINAL HERNIA REPAIR     age 63   LAPAROSCOPY N/A 11/02/2022   Procedure: DIAGNOSTIC LAPAROSCOPY;  Surgeon: Tanda Locus, MD;  Location: [facility];  Service: General;  Laterality: N/A;   REVISION OF TOTAL SHOULDER  2017   soft palate reduction     TOTAL SHOULDER REPLACEMENT Right 2017   TRANSURETHRAL RESECTION OF PROSTATE     X 2    Social History:  reports that he quit smoking about 26 years ago. His  smoking use included cigarettes. He started smoking about 58 years ago. He has a 32 pack-year smoking history. He has never used smokeless tobacco. He reports current alcohol use. He reports current drug use. Drug: Marijuana.   Allergies  Allergen Reactions   Iodinated Contrast Media Hives and Itching   Oxytetracycline Shortness Of Breath and Rash    TERRAMYCIN - Respiratory arrest   Ropinirole      Confusion (intolerance), Memory impairment    Silicone Hives and Rash   Tape Hives and Rash   Amoxicillin Nausea Only   Tetracycline     Respiratory arrest (Terramycin)    Nsaids Itching,  Nausea And Vomiting and Other (See Comments)    Hx gastric bypass can not take nsaids    Family History  Problem Relation Age of Onset   Rheum arthritis Mother    Diabetes Mother    Heart disease Father    Colon cancer Neg Hx      Prior to Admission medications   Medication Sig Start Date End Date Taking? Authorizing Provider  amLODipine  (NORVASC ) 5 MG tablet Take 5 mg by mouth daily. 05/13/22   [provider]  ARIPiprazole  (ABILIFY ) 20 MG tablet Take 20 mg by mouth daily. 05/21/22   [provider]  atorvastatin  (LIPITOR) 10 MG tablet Take 10 mg by mouth daily. 09/09/22   [provider]  FLUoxetine  (PROZAC ) 40 MG capsule Take 40 mg by mouth daily. 05/21/22   [provider]  tamsulosin  (FLOMAX ) 0.4 MG CAPS capsule Take 0.4 mg by mouth daily.    [provider]    Physical Exam: BP (!) 157/63   Pulse 95   Temp 98.5 F (36.9 C) (Tympanic)   Resp 19   Ht 6' 2 (1.88 m)   Wt 86.6 kg   SpO2 99%   BMI 24.52 kg/m   General: 73 y.o. year-old male ill appearing, but in no acute distress.  Alert and oriented x3. HEENT: NCAT, EOMI Neck: Supple, trachea medial Cardiovascular: Regular rate and rhythm with no rubs or gallops. +JVD.   +2 lower extremity edema bilaterally. 2/4 pulses in all 4 extremities. Respiratory: Bilateral Rales in lower lobes on auscultation with no wheezes.  Abdomen: Soft, nontender nondistended with normal bowel sounds x4 quadrants. Muskuloskeletal: No cyanosis, clubbing noted bilaterally Neuro: CN II-XII intact, strength 5/5 x 4, sensation, reflexes intact Skin: No ulcerative lesions noted or rashes Psychiatry: Judgement and insight appear normal. Mood is appropriate for condition and setting          Labs on Admission:  Basic Metabolic Panel: Recent Labs  Lab 10/21/23 0008  NA 136  K 3.9  CL 106  CO2 16*  GLUCOSE 108*  BUN 81*  CREATININE 5.35*  CALCIUM  9.1   Liver Function Tests: No results for  input(s): AST, ALT, ALKPHOS, BILITOT, PROT, ALBUMIN in the last 168 hours. No results for input(s): LIPASE, AMYLASE in the last 168 hours. No results for input(s): AMMONIA in the last 168 hours. CBC: Recent Labs  Lab 10/21/23 0008  WBC 9.3  NEUTROABS 6.9  HGB 9.3*  HCT 27.8*  MCV 97.2  PLT 291   Cardiac Enzymes: No results for input(s): CKTOTAL, CKMB, CKMBINDEX, TROPONINI in the last 168 hours.  BNP (last 3 results) Recent Labs    04/16/23 1019 10/21/23 0018  BNP 97.0 192.0*    ProBNP (last 3 results) No results for input(s): PROBNP in the last 8760 hours.  CBG: No results for input(s): GLUCAP in  the last 168 hours.  Radiological Exams on Admission: DG Chest Portable 1 View Result Date: 10/21/2023 CLINICAL DATA:  Chest pain and unusual pressure. EXAM: PORTABLE CHEST 1 VIEW COMPARISON:  04/16/2023 FINDINGS: Heart size and pulmonary vascularity are normal for technique. Emphysematous changes in the lungs. Diffuse interstitial pattern suggesting fibrosis. Peribronchial thickening and streaky perihilar opacities suggesting bronchitis or airways disease. Interstitial changes are somewhat more prominent than on prior study which could indicate mild superimposed edema or acute airways disease. Small bilateral pleural effusions. No pneumothorax. Mediastinal contours appear intact. Postoperative changes in the right shoulder. IMPRESSION: 1. Emphysematous changes and chronic bronchitic changes and fibrosis in the lungs. 2. Interstitial changes are somewhat increased which may indicate developing interstitial process since prior study. Small bilateral pleural effusions. Electronically Signed   By: Elsie Gravely M.D.   On: 10/21/2023 00:26    EKG: I independently viewed the EKG done and my findings are as followed: Normal sinus rhythm at a rate of 99 bpm with QTc of 536 ms  Assessment/Plan Present on Admission:  Atypical chest pain  Essential  hypertension  Principal Problem:   Atypical chest pain Active Problems:   Essential hypertension   Elevated brain natriuretic peptide (BNP) level   Elevated troponin   Prolonged QT interval   Acute kidney injury superimposed on stage 5 chronic kidney disease, not on chronic dialysis (HCC)   Metabolic acidosis   Mixed hyperlipidemia   BPH (benign prostatic hyperplasia)   Depression  Atypical chest pain Continue telemetry  Troponins x2 -48 > 46 EKG showed normal sinus rhythm at rate of 99 bpm with QTc of 536 ms Cardiology will be consulted to help decide if Stress test is needed in am Versus other  diagnostic modalities.    Nitroglycerin  ointment was given in the ED  2. Elevated BNP rule out new onset CHF BNP 192 Continue total input/output, daily weights and fluid restriction IV Lasix  40 mg x 1 was given in the ED; continue IV Lasix  40 mg twice daily Continue heart healthy diet  Echocardiogram in the morning  Cardiology will be consulted admission await further recommendation  3. Elevated troponin possibly secondary to type II demand ischemia Troponin 48 > 46; this has flattened out He denies chest pain at this time  4. Prolonged QT interval QTc 536 ms Avoid QT prolonging drugs Magnesium level will be checked Repeat EKG in the morning  5. Acute kidney injury superimposed on CKD stage 5 BUN/creatinine 81/5.35 (baseline at 2.2-2.7) Patient appears to be volume overloaded He has RUE fistula, but yet to start dialysis Nephrology will be consulted and we will await further recommendations Renally adjust medications, avoid nephrotoxic agents/dehydration/hypotension  6. Metabolic acidosis possibly secondary to uremia due to #5 BUN 81 Nephrology will be consulted and we shall await further recommendations  7. Essential hypertension Continue IV Lasix  40 mg twice daily  8. Mixed hyperlipidemia Continue Lipitor  9. BPH Continue Flomax   10. Depression Continue  Prozac   DVT prophylaxis: Subcu heparin   Code Status: Full code  Family Communication: None at bedside  Consults: Cardiology, nephrology  Severity of Illness: The appropriate patient status for this patient is INPATIENT. Inpatient status is judged to be reasonable and necessary in order to provide the required intensity of service to ensure the patient's safety. The patient's presenting symptoms, physical exam findings, and initial radiographic and laboratory data in the context of their chronic comorbidities is felt to place them at high risk for further clinical deterioration. Furthermore, it  is not anticipated that the patient will be medically stable for discharge from the hospital within 2 midnights of admission.   * I certify that at the point of admission it is my clinical judgment that the patient will require inpatient hospital care spanning beyond 2 midnights from the point of admission due to high intensity of service, high risk for further deterioration and high frequency of surveillance required.*  Author: Annalysa Mohammad, DO 10/21/2023 4:47 AM  For on call review [email or web address].

## 2023-10-21 NOTE — Progress Notes (Signed)
*  PRELIMINARY RESULTS* Echocardiogram 2D Echocardiogram has been performed.  Bryan Manning 10/21/2023, 12:30 PM

## 2023-10-21 NOTE — ED Notes (Signed)
 Bandage changed. Clean and dry with no visible signs of infection

## 2023-10-22 DIAGNOSIS — R0789 Other chest pain: Secondary | ICD-10-CM | POA: Diagnosis not present

## 2023-10-22 LAB — COMPREHENSIVE METABOLIC PANEL WITH GFR
ALT: 15 U/L (ref 0–44)
AST: 15 U/L (ref 15–41)
Albumin: 3 g/dL — ABNORMAL LOW (ref 3.5–5.0)
Alkaline Phosphatase: 53 U/L (ref 38–126)
Anion gap: 10 (ref 5–15)
BUN: 85 mg/dL — ABNORMAL HIGH (ref 8–23)
CO2: 19 mmol/L — ABNORMAL LOW (ref 22–32)
Calcium: 8.7 mg/dL — ABNORMAL LOW (ref 8.9–10.3)
Chloride: 107 mmol/L (ref 98–111)
Creatinine, Ser: 5.9 mg/dL — ABNORMAL HIGH (ref 0.61–1.24)
GFR, Estimated: 10 mL/min — ABNORMAL LOW (ref >60)
Glucose, Bld: 117 mg/dL — ABNORMAL HIGH (ref 70–99)
Potassium: 3.6 mmol/L (ref 3.5–5.1)
Sodium: 136 mmol/L (ref 135–145)
Total Bilirubin: 0.5 mg/dL (ref 0.0–1.2)
Total Protein: 5.6 g/dL — ABNORMAL LOW (ref 6.5–8.1)

## 2023-10-22 LAB — CBC
HCT: 25.3 % — ABNORMAL LOW (ref 39.0–52.0)
Hemoglobin: 7.9 g/dL — ABNORMAL LOW (ref 13.0–17.0)
MCH: 30.7 pg (ref 26.0–34.0)
MCHC: 31.2 g/dL (ref 30.0–36.0)
MCV: 98.4 fL (ref 80.0–100.0)
Platelets: 223 K/uL (ref 150–400)
RBC: 2.57 MIL/uL — ABNORMAL LOW (ref 4.22–5.81)
RDW: 13.2 % (ref 11.5–15.5)
WBC: 7.1 K/uL (ref 4.0–10.5)
nRBC: 0 % (ref 0.0–0.2)

## 2023-10-22 LAB — PARATHYROID HORMONE, INTACT (NO CA): PTH: 181 pg/mL — ABNORMAL HIGH (ref 15–65)

## 2023-10-22 LAB — HIV ANTIBODY (ROUTINE TESTING W REFLEX): HIV Screen 4th Generation wRfx: NONREACTIVE

## 2023-10-22 MED ORDER — DARBEPOETIN ALFA 60 MCG/0.3ML IJ SOSY
60.0000 ug | PREFILLED_SYRINGE | INTRAMUSCULAR | Status: DC
  Administered 2023-10-22: 60 ug via SUBCUTANEOUS
  Filled 2023-10-22: qty 0.3

## 2023-10-22 MED ORDER — FUROSEMIDE 40 MG PO TABS
40.0000 mg | ORAL_TABLET | Freq: Two times a day (BID) | ORAL | Status: DC
  Administered 2023-10-22 – 2023-10-23 (×2): 40 mg via ORAL
  Filled 2023-10-22 (×2): qty 1

## 2023-10-22 NOTE — Plan of Care (Signed)
  Problem: Education: Goal: Knowledge of General Education information will improve Description: Including pain rating scale, medication(s)/side effects and non-pharmacologic comfort measures Outcome: Progressing   Problem: Health Behavior/Discharge Planning: Goal: Ability to manage health-related needs will improve Outcome: Progressing   Problem: Clinical Measurements: Goal: Ability to maintain clinical measurements within normal limits will improve Outcome: Progressing Goal: Will remain free from infection Outcome: Progressing Goal: Diagnostic test results will improve Outcome: Progressing   Problem: Activity: Goal: Risk for activity intolerance will decrease Outcome: Progressing   Problem: Pain Managment: Goal: General experience of comfort will improve and/or be controlled Outcome: Progressing   Problem: Skin Integrity: Goal: Risk for impaired skin integrity will decrease Outcome: Progressing

## 2023-10-22 NOTE — Progress Notes (Signed)
 PROGRESS NOTE    Bryan Manning.  FMW:[id number] DOB: 1951-01-18 DOA: 10/21/2023 PCP: Myra Geni ORN, FNP    Brief Narrative:  Bryan Manning. is a 73 y.o. male with medical history significant of hypertension, hyperlipidemia, depression, CKD stage 4, BPH who presents to the emergency department due to chest pain and shortness of breath.  He complained of difficulty in laying flat in bed due to worsening shortness of breath and increased chest pressure which improves on sitting up.  He also complained of shortness of breath on minimal exertion.  He complained of several weeks of worsening bilateral leg swelling despite being on Lasix  10 mg daily.  He endorsed a history of CKD with a right upper extremity fistula, but he has not started dialysis.    Assessment and Plan: Atypical chest pain -Cardiology consult:Echocardiogram showed a preserved EF of 55 to 60% with mild LVH, grade 1 diastolic dysfunction, normal RV function and mild aortic valve stenosis. Diuretic therapy has been deferred to Nephrology in the setting of his acute on chronic Stage 5 CKD. No additional cardiac recommendations at this time. Will arrange for Cardiology follow-up in several months for reassessment. AS will be followed as an outpatient.    Elevated BNP-suspect due to grade 1 diastolic dysfunction BNP 192 Continue total input/output, daily weights and fluid restriction -Lasix  per nephrology  Elevated troponin - Suspect due to kidney disease    Prolonged QT interval Avoid QT prolonging drugs -trend Qtc  Acute kidney injury superimposed on CKD stage 5 (baseline at 2.2-2.7) He has RUE fistula, but yet to start dialysis Nephrology and appreciate management   Metabolic acidosis possibly secondary to uremia due to #5 BUN 81 Nephrology will be consulted and we shall await further recommendations    Essential hypertension Continue IV Lasix  40 mg twice daily    Mixed hyperlipidemia Continue Lipitor     BPH Continue Flomax    Depression Continue Prozac    DVT prophylaxis: heparin  injection 5,000 Units Start: 10/21/23 0600 SCDs Start: 10/21/23 0434    Code Status: Full Code Family Communication:   Disposition Plan:  Level of care: Med-Surg Status is: Inpatient     Consultants:  Cardiology Nephrology     Subjective: Patient sitting up in chair at bedside, did not sleep well but says he never sleeps well in the hospital  Objective: Vitals:   10/21/23 2319 10/22/23 0444 10/22/23 0500 10/22/23 0914  BP: (!) 112/44 (!) 133/49  (!) 135/50  Pulse: 79 79    Resp: 18 17    Temp: 98.1 F (36.7 C) 97.9 F (36.6 C)    TempSrc: Oral     SpO2: 97% 95%    Weight:   88 kg   Height:        Intake/Output Summary (Last 24 hours) at 10/22/2023 0959 Last data filed at 10/22/2023 0915 Gross per 24 hour  Intake 1790 ml  Output 2950 ml  Net -1160 ml   Filed Weights   10/21/23 0022 10/21/23 1450 10/22/23 0500  Weight: 86.6 kg 88.1 kg 88 kg    Examination:   General: Appearance:    Well developed, well nourished male in no acute distress     Lungs:     respirations unlabored  Heart:    Normal heart rate. Normal rhythm. No murmurs, rubs, or gallops.    MS:   All extremities are intact.    Neurologic:   Awake, alert       Data Reviewed: I  have personally reviewed following labs and imaging studies  CBC: Recent Labs  Lab 10/21/23 0008 10/22/23 0431  WBC 9.3 7.1  NEUTROABS 6.9  --   HGB 9.3* 7.9*  HCT 27.8* 25.3*  MCV 97.2 98.4  PLT 291 223   Basic Metabolic Panel: Recent Labs  Lab 10/21/23 0008 10/21/23 0200 10/22/23 0431  NA 136  --  136  K 3.9  --  3.6  CL 106  --  107  CO2 16*  --  19*  GLUCOSE 108*  --  117*  BUN 81*  --  85*  CREATININE 5.35*  --  5.90*  CALCIUM  9.1  --  8.7*  MG  --  2.6*  --   PHOS  --  7.1*  --    GFR: Estimated Creatinine Clearance: 13.5 mL/min (A) (by C-G formula based on SCr of 5.9 mg/dL (H)). Liver Function  Tests: Recent Labs  Lab 10/22/23 0431  AST 15  ALT 15  ALKPHOS 53  BILITOT 0.5  PROT 5.6*  ALBUMIN 3.0*   No results for input(s): LIPASE, AMYLASE in the last 168 hours. No results for input(s): AMMONIA in the last 168 hours. Coagulation Profile: No results for input(s): INR, PROTIME in the last 168 hours. Cardiac Enzymes: No results for input(s): CKTOTAL, CKMB, CKMBINDEX, TROPONINI in the last 168 hours. BNP (last 3 results) No results for input(s): PROBNP in the last 8760 hours. HbA1C: No results for input(s): HGBA1C in the last 72 hours. CBG: No results for input(s): GLUCAP in the last 168 hours. Lipid Profile: No results for input(s): CHOL, HDL, LDLCALC, TRIG, CHOLHDL, LDLDIRECT in the last 72 hours. Thyroid  Function Tests: No results for input(s): TSH, T4TOTAL, FREET4, T3FREE, THYROIDAB in the last 72 hours. Anemia Panel: Recent Labs    10/21/23 1243  TIBC 276  IRON 69   Sepsis Labs: No results for input(s): PROCALCITON, LATICACIDVEN in the last 168 hours.  No results found for this or any previous visit (from the past 240 hours).       Radiology Studies: ECHOCARDIOGRAM COMPLETE Result Date: 10/21/2023    ECHOCARDIOGRAM REPORT   Patient Name:   Ramsey Guadamuz. Date of Exam: 10/21/2023 Medical Rec #:  [id number]          Height:       74.0 in Accession #:    [id number]         Weight:       191.0 lb Date of Birth:  04/07/1951         BSA:          2.131 m Patient Age:    72 years           BP:           151/68 mmHg Patient Gender: M                  HR:           91 bpm. Exam Location:  [facility] Procedure: 2D Echo, Cardiac Doppler and Color Doppler (Both Spectral and Color            Flow Doppler were utilized during procedure). Indications:    CHF-Acute Diastolic I50.31  History:        Patient has no prior history of Echocardiogram examinations.                 Risk Factors:Hypertension, Diabetes and  Dyslipidemia.  Sonographer:    Aida Pizza RCS  Referring Phys: [id number] OLADAPO ADEFESO IMPRESSIONS  1. Left ventricular ejection fraction, by estimation, is 55 to 60%. The left ventricle has normal function. The left ventricle has no regional wall motion abnormalities. There is mild left ventricular hypertrophy. Left ventricular diastolic parameters are consistent with Grade I diastolic dysfunction (impaired relaxation). Elevated left ventricular end-diastolic pressure.  2. Right ventricular systolic function is normal. The right ventricular size is normal. Tricuspid regurgitation signal is inadequate for assessing PA pressure.  3. Left atrial size was severely dilated.  4. Right atrial size was moderately dilated.  5. The mitral valve is normal in structure. Trivial mitral valve regurgitation. No evidence of mitral stenosis.  6. The aortic valve is tricuspid. Aortic valve regurgitation is not visualized. Mild aortic valve stenosis. Aortic valve Vmax measures 2.15 m/s.  7. The inferior vena cava is dilated in size with >50% respiratory variability, suggesting right atrial pressure of 8 mmHg.  8. Increased flow velocities may be secondary to anemia, thyrotoxicosis, hyperdynamic or high flow state. Comparison(s): No prior Echocardiogram. FINDINGS  Left Ventricle: Left ventricular ejection fraction, by estimation, is 55 to 60%. The left ventricle has normal function. The left ventricle has no regional wall motion abnormalities. Strain was performed and the global longitudinal strain is indeterminate. The left ventricular internal cavity size was normal in size. There is mild left ventricular hypertrophy. Left ventricular diastolic parameters are consistent with Grade I diastolic dysfunction (impaired relaxation). Elevated left ventricular end-diastolic pressure. Right Ventricle: The right ventricular size is normal. No increase in right ventricular wall thickness. Right ventricular systolic function is normal.  Tricuspid regurgitation signal is inadequate for assessing PA pressure. Left Atrium: Left atrial size was severely dilated. Right Atrium: Right atrial size was moderately dilated. Pericardium: There is no evidence of pericardial effusion. Mitral Valve: The mitral valve is normal in structure. Trivial mitral valve regurgitation. No evidence of mitral valve stenosis. Tricuspid Valve: The tricuspid valve is normal in structure. Tricuspid valve regurgitation is trivial. No evidence of tricuspid stenosis. Aortic Valve: The aortic valve is tricuspid. Aortic valve regurgitation is not visualized. Mild aortic stenosis is present. Aortic valve peak gradient measures 18.5 mmHg. Pulmonic Valve: The pulmonic valve was not well visualized. Pulmonic valve regurgitation is trivial. No evidence of pulmonic stenosis. Aorta: The aortic root is normal in size and structure. Venous: The inferior vena cava is dilated in size with greater than 50% respiratory variability, suggesting right atrial pressure of 8 mmHg. IAS/Shunts: No atrial level shunt detected by color flow Doppler. Additional Comments: 3D was performed not requiring image post processing on an independent workstation and was indeterminate.  LEFT VENTRICLE PLAX 2D LVIDd:         4.60 cm   Diastology LVIDs:         3.00 cm   LV e' medial:    9.11 cm/s LV PW:         1.20 cm   LV E/e' medial:  15.6 LV IVS:        1.20 cm   LV e' lateral:   9.36 cm/s LVOT diam:     2.10 cm   LV E/e' lateral: 15.2 LV SV:         112 LV SV Index:   52 LVOT Area:     3.46 cm  RIGHT VENTRICLE RV S prime:     25.90 cm/s TAPSE (M-mode): 3.1 cm LEFT ATRIUM              Index  RIGHT ATRIUM           Index LA diam:        4.60 cm  2.16 cm/m   RA Area:     25.30 cm LA Vol (A2C):   132.0 ml 61.93 ml/m  RA Volume:   84.20 ml  39.51 ml/m LA Vol (A4C):   81.7 ml  38.33 ml/m LA Biplane Vol: 104.0 ml 48.80 ml/m  AORTIC VALVE AV Area (Vmax): 2.48 cm AV Vmax:        215.00 cm/s AV Peak Grad:    18.5 mmHg LVOT Vmax:      154.00 cm/s LVOT Vmean:     106.000 cm/s LVOT VTI:       0.323 m  AORTA Ao Root diam: 3.60 cm MITRAL VALVE MV Area (PHT): 4.60 cm     SHUNTS MV Decel Time: 165 msec     Systemic VTI:  0.32 m MV E velocity: 142.00 cm/s  Systemic Diam: 2.10 cm MV A velocity: 163.00 cm/s MV E/A ratio:  0.87 Vishnu Priya Mallipeddi Electronically signed by Diannah Late Mallipeddi Signature Date/Time: 10/21/2023/3:46:40 PM    Final    DG Chest Portable 1 View Result Date: 10/21/2023 CLINICAL DATA:  Chest pain and unusual pressure. EXAM: PORTABLE CHEST 1 VIEW COMPARISON:  04/16/2023 FINDINGS: Heart size and pulmonary vascularity are normal for technique. Emphysematous changes in the lungs. Diffuse interstitial pattern suggesting fibrosis. Peribronchial thickening and streaky perihilar opacities suggesting bronchitis or airways disease. Interstitial changes are somewhat more prominent than on prior study which could indicate mild superimposed edema or acute airways disease. Small bilateral pleural effusions. No pneumothorax. Mediastinal contours appear intact. Postoperative changes in the right shoulder. IMPRESSION: 1. Emphysematous changes and chronic bronchitic changes and fibrosis in the lungs. 2. Interstitial changes are somewhat increased which may indicate developing interstitial process since prior study. Small bilateral pleural effusions. Electronically Signed   By: Elsie Gravely M.D.   On: 10/21/2023 00:26        Scheduled Meds:  amLODipine   5 mg Oral Daily   ARIPiprazole   20 mg Oral Daily   atorvastatin   10 mg Oral Daily   FLUoxetine   40 mg Oral Daily   furosemide   40 mg Intravenous Q12H   heparin   5,000 Units Subcutaneous Q8H   pantoprazole   40 mg Oral Daily   tamsulosin   0.4 mg Oral Daily   Continuous Infusions:   LOS: 1 day    Time spent: 45 minutes spent on chart review, discussion with nursing staff, consultants, updating family and interview/physical exam; more than 50%  of that time was spent in counseling and/or coordination of care.    Harlene RAYMOND Bowl, DO [facility] Available via Epic secure chat 7am-7pm After these hours, please refer to coverage provider listed on [email or web address] 10/22/2023, 9:59 AM

## 2023-10-22 NOTE — Progress Notes (Addendum)
 Patient ID: Bryan Daughety., male   DOB: [birth year], 73 y.o.   MRN: [id number] S: Feels well.  Reports resolution of lower extremity edema and sob. O:BP (!) 135/50   Pulse 79   Temp 97.9 F (36.6 C)   Resp 17   Ht 6' 2 (1.88 m)   Wt 88 kg   SpO2 95%   BMI 24.91 kg/m   Intake/Output Summary (Last 24 hours) at 10/22/2023 1123 Last data filed at 10/22/2023 1007 Gross per 24 hour  Intake 1790 ml  Output 3250 ml  Net -1460 ml   Intake/Output: I/O last 3 completed shifts: In: 1440 [P.O.:1440] Out: 4275 [Urine:4275]  Intake/Output this shift:  Total I/O In: 350 [P.O.:350] Out: 500 [Urine:500] Weight change: 1.463 kg Gen: NAD CVS: RRR Resp:CTA Abd: +BS, soft, NT/ND Ext: no edema, RUE AVF +T/B  Recent Labs  Lab 10/21/23 0008 10/21/23 0200 10/22/23 0431  NA 136  --  136  K 3.9  --  3.6  CL 106  --  107  CO2 16*  --  19*  GLUCOSE 108*  --  117*  BUN 81*  --  85*  CREATININE 5.35*  --  5.90*  ALBUMIN  --   --  3.0*  CALCIUM  9.1  --  8.7*  PHOS  --  7.1*  --   AST  --   --  15  ALT  --   --  15   Liver Function Tests: Recent Labs  Lab 10/22/23 0431  AST 15  ALT 15  ALKPHOS 53  BILITOT 0.5  PROT 5.6*  ALBUMIN 3.0*   No results for input(s): LIPASE, AMYLASE in the last 168 hours. No results for input(s): AMMONIA in the last 168 hours. CBC: Recent Labs  Lab 10/21/23 0008 10/22/23 0431  WBC 9.3 7.1  NEUTROABS 6.9  --   HGB 9.3* 7.9*  HCT 27.8* 25.3*  MCV 97.2 98.4  PLT 291 223   Cardiac Enzymes: No results for input(s): CKTOTAL, CKMB, CKMBINDEX, TROPONINI in the last 168 hours. CBG: No results for input(s): GLUCAP in the last 168 hours.  Iron Studies:  Recent Labs    10/21/23 1243  IRON 69  TIBC 276   Studies/Results: ECHOCARDIOGRAM COMPLETE Result Date: 10/21/2023    ECHOCARDIOGRAM REPORT   Patient Name:   Bryan Devino. Date of Exam: 10/21/2023 Medical Rec #:  [id number]          Height:       74.0 in Accession #:     [id number]         Weight:       191.0 lb Date of Birth:  10/21/1950         BSA:          2.131 m Patient Age:    72 years           BP:           151/68 mmHg Patient Gender: M                  HR:           91 bpm. Exam Location:  [facility] Procedure: 2D Echo, Cardiac Doppler and Color Doppler (Both Spectral and Color            Flow Doppler were utilized during procedure). Indications:    CHF-Acute Diastolic I50.31  History:        Patient has no prior history of Echocardiogram  examinations.                 Risk Factors:Hypertension, Diabetes and Dyslipidemia.  Sonographer:    Aida Pizza RCS Referring Phys: [id number] OLADAPO ADEFESO IMPRESSIONS  1. Left ventricular ejection fraction, by estimation, is 55 to 60%. The left ventricle has normal function. The left ventricle has no regional wall motion abnormalities. There is mild left ventricular hypertrophy. Left ventricular diastolic parameters are consistent with Grade I diastolic dysfunction (impaired relaxation). Elevated left ventricular end-diastolic pressure.  2. Right ventricular systolic function is normal. The right ventricular size is normal. Tricuspid regurgitation signal is inadequate for assessing PA pressure.  3. Left atrial size was severely dilated.  4. Right atrial size was moderately dilated.  5. The mitral valve is normal in structure. Trivial mitral valve regurgitation. No evidence of mitral stenosis.  6. The aortic valve is tricuspid. Aortic valve regurgitation is not visualized. Mild aortic valve stenosis. Aortic valve Vmax measures 2.15 m/s.  7. The inferior vena cava is dilated in size with >50% respiratory variability, suggesting right atrial pressure of 8 mmHg.  8. Increased flow velocities may be secondary to anemia, thyrotoxicosis, hyperdynamic or high flow state. Comparison(s): No prior Echocardiogram. FINDINGS  Left Ventricle: Left ventricular ejection fraction, by estimation, is 55 to 60%. The left ventricle has normal function.  The left ventricle has no regional wall motion abnormalities. Strain was performed and the global longitudinal strain is indeterminate. The left ventricular internal cavity size was normal in size. There is mild left ventricular hypertrophy. Left ventricular diastolic parameters are consistent with Grade I diastolic dysfunction (impaired relaxation). Elevated left ventricular end-diastolic pressure. Right Ventricle: The right ventricular size is normal. No increase in right ventricular wall thickness. Right ventricular systolic function is normal. Tricuspid regurgitation signal is inadequate for assessing PA pressure. Left Atrium: Left atrial size was severely dilated. Right Atrium: Right atrial size was moderately dilated. Pericardium: There is no evidence of pericardial effusion. Mitral Valve: The mitral valve is normal in structure. Trivial mitral valve regurgitation. No evidence of mitral valve stenosis. Tricuspid Valve: The tricuspid valve is normal in structure. Tricuspid valve regurgitation is trivial. No evidence of tricuspid stenosis. Aortic Valve: The aortic valve is tricuspid. Aortic valve regurgitation is not visualized. Mild aortic stenosis is present. Aortic valve peak gradient measures 18.5 mmHg. Pulmonic Valve: The pulmonic valve was not well visualized. Pulmonic valve regurgitation is trivial. No evidence of pulmonic stenosis. Aorta: The aortic root is normal in size and structure. Venous: The inferior vena cava is dilated in size with greater than 50% respiratory variability, suggesting right atrial pressure of 8 mmHg. IAS/Shunts: No atrial level shunt detected by color flow Doppler. Additional Comments: 3D was performed not requiring image post processing on an independent workstation and was indeterminate.  LEFT VENTRICLE PLAX 2D LVIDd:         4.60 cm   Diastology LVIDs:         3.00 cm   LV e' medial:    9.11 cm/s LV PW:         1.20 cm   LV E/e' medial:  15.6 LV IVS:        1.20 cm   LV e'  lateral:   9.36 cm/s LVOT diam:     2.10 cm   LV E/e' lateral: 15.2 LV SV:         112 LV SV Index:   52 LVOT Area:     3.46 cm  RIGHT VENTRICLE RV  S prime:     25.90 cm/s TAPSE (M-mode): 3.1 cm LEFT ATRIUM              Index        RIGHT ATRIUM           Index LA diam:        4.60 cm  2.16 cm/m   RA Area:     25.30 cm LA Vol (A2C):   132.0 ml 61.93 ml/m  RA Volume:   84.20 ml  39.51 ml/m LA Vol (A4C):   81.7 ml  38.33 ml/m LA Biplane Vol: 104.0 ml 48.80 ml/m  AORTIC VALVE AV Area (Vmax): 2.48 cm AV Vmax:        215.00 cm/s AV Peak Grad:   18.5 mmHg LVOT Vmax:      154.00 cm/s LVOT Vmean:     106.000 cm/s LVOT VTI:       0.323 m  AORTA Ao Root diam: 3.60 cm MITRAL VALVE MV Area (PHT): 4.60 cm     SHUNTS MV Decel Time: 165 msec     Systemic VTI:  0.32 m MV E velocity: 142.00 cm/s  Systemic Diam: 2.10 cm MV A velocity: 163.00 cm/s MV E/A ratio:  0.87 Vishnu Priya Mallipeddi Electronically signed by Diannah Late Mallipeddi Signature Date/Time: 10/21/2023/3:46:40 PM    Final    DG Chest Portable 1 View Result Date: 10/21/2023 CLINICAL DATA:  Chest pain and unusual pressure. EXAM: PORTABLE CHEST 1 VIEW COMPARISON:  04/16/2023 FINDINGS: Heart size and pulmonary vascularity are normal for technique. Emphysematous changes in the lungs. Diffuse interstitial pattern suggesting fibrosis. Peribronchial thickening and streaky perihilar opacities suggesting bronchitis or airways disease. Interstitial changes are somewhat more prominent than on prior study which could indicate mild superimposed edema or acute airways disease. Small bilateral pleural effusions. No pneumothorax. Mediastinal contours appear intact. Postoperative changes in the right shoulder. IMPRESSION: 1. Emphysematous changes and chronic bronchitic changes and fibrosis in the lungs. 2. Interstitial changes are somewhat increased which may indicate developing interstitial process since prior study. Small bilateral pleural effusions. Electronically Signed    By: Elsie Gravely M.D.   On: 10/21/2023 00:26    amLODipine   5 mg Oral Daily   ARIPiprazole   20 mg Oral Daily   atorvastatin   10 mg Oral Daily   FLUoxetine   40 mg Oral Daily   furosemide   40 mg Intravenous Q12H   heparin   5,000 Units Subcutaneous Q8H   pantoprazole   40 mg Oral Daily   tamsulosin   0.4 mg Oral Daily    BMET    Component Value Date/Time   NA 136 10/22/2023 0431   K 3.6 10/22/2023 0431   CL 107 10/22/2023 0431   CO2 19 (L) 10/22/2023 0431   GLUCOSE 117 (H) 10/22/2023 0431   BUN 85 (H) 10/22/2023 0431   CREATININE 5.90 (H) 10/22/2023 0431   CALCIUM  8.7 (L) 10/22/2023 0431   GFRNONAA 10 (L) 10/22/2023 0431   GFRAA 30 (L) 01/12/2018 1254   CBC    Component Value Date/Time   WBC 7.1 10/22/2023 0431   RBC 2.57 (L) 10/22/2023 0431   HGB 7.9 (L) 10/22/2023 0431   HCT 25.3 (L) 10/22/2023 0431   PLT 223 10/22/2023 0431   MCV 98.4 10/22/2023 0431   MCH 30.7 10/22/2023 0431   MCHC 31.2 10/22/2023 0431   RDW 13.2 10/22/2023 0431   LYMPHSABS 1.3 10/21/2023 0008   MONOABS 0.7 10/21/2023 0008   EOSABS 0.4 10/21/2023 0008  BASOSABS 0.1 10/21/2023 0008    Assessment/Plan:  Progressive CKD now at stage V - he is without uremic symptoms.  He has had significant decline in his renal function over the past 3 months which likely caused acute CHF.  He has responded well to IV lasix  with resolution.  Scr is up.  Will stop IV lasix  and start po 40 mg bid and follow UOP and Scr.  No urgent indication for RRT at this time, however he is nearing the need to start if his BUN/Cr continue to climb.  If his BUN/Cr stabilize on po lasix , he could be discharged over the weekend and follow up with his primary nephrologist.   Avoid nephrotoxic medications including NSAIDs and iodinated intravenous contrast exposure unless the latter is absolutely indicated.   Preferred narcotic agents for pain control are hydromorphone , fentanyl , and methadone. Morphine should not be used.  Avoid  Baclofen and avoid oral sodium phosphate  and magnesium citrate based laxatives / bowel preps.  Continue strict Input and Output monitoring.  Will monitor the patient closely with you and intervene or adjust therapy as indicated by changes in clinical status/labs  No blood draws or Bp in right arm Chest pressure - cardiology consulted  New onset CHF - agree that it is likely due to worsening renal function.  Cardiology following and ECHO pending. HTN - continue with home regimen for now and follow with diuresis. LBBB - per Cardiology Anemia of CKD stage IV-V - iron stores stable and will start ESA CKD-BMD - phos elevated and iPTH was 134 7 months ago.  Will start binders and follow.   Fairy RONAL Sellar, MD [facility]   Pt will not be physically seen over the weekend, however his labs will be reviewed remotely and on call coverage will be available if needed.

## 2023-10-22 NOTE — Progress Notes (Signed)
    Please refer to full Cardiology consult note from 10/21/2023. Echocardiogram showed a preserved EF of 55 to 60% with mild LVH, grade 1 diastolic dysfunction, normal RV function and mild aortic valve stenosis. Diuretic therapy has been deferred to Nephrology in the setting of his acute on chronic Stage 5 CKD. No additional cardiac recommendations at this time. Will arrange for Cardiology follow-up in several months for reassessment. AS will be followed as an outpatient.   Signed, Laymon CHRISTELLA Qua, PA-C 10/22/2023, 8:33 AM Pager: [phone]

## 2023-10-23 DIAGNOSIS — R0789 Other chest pain: Secondary | ICD-10-CM | POA: Diagnosis not present

## 2023-10-23 LAB — BASIC METABOLIC PANEL WITH GFR
Anion gap: 14 (ref 5–15)
BUN: 86 mg/dL — ABNORMAL HIGH (ref 8–23)
CO2: 20 mmol/L — ABNORMAL LOW (ref 22–32)
Calcium: 8.9 mg/dL (ref 8.9–10.3)
Chloride: 103 mmol/L (ref 98–111)
Creatinine, Ser: 5.82 mg/dL — ABNORMAL HIGH (ref 0.61–1.24)
GFR, Estimated: 10 mL/min — ABNORMAL LOW (ref >60)
Glucose, Bld: 108 mg/dL — ABNORMAL HIGH (ref 70–99)
Potassium: 3.8 mmol/L (ref 3.5–5.1)
Sodium: 137 mmol/L (ref 135–145)

## 2023-10-23 LAB — CBC
HCT: 26.7 % — ABNORMAL LOW (ref 39.0–52.0)
Hemoglobin: 8.8 g/dL — ABNORMAL LOW (ref 13.0–17.0)
MCH: 32 pg (ref 26.0–34.0)
MCHC: 33 g/dL (ref 30.0–36.0)
MCV: 97.1 fL (ref 80.0–100.0)
Platelets: 256 K/uL (ref 150–400)
RBC: 2.75 MIL/uL — ABNORMAL LOW (ref 4.22–5.81)
RDW: 13 % (ref 11.5–15.5)
WBC: 7.2 K/uL (ref 4.0–10.5)
nRBC: 0 % (ref 0.0–0.2)

## 2023-10-23 MED ORDER — FUROSEMIDE 40 MG PO TABS: 40.0000 mg | ORAL_TABLET | Freq: Two times a day (BID) | ORAL | 0 refills | Status: AC

## 2023-10-23 NOTE — Discharge Summary (Signed)
 Physician Discharge Summary  Bryan Manning. FMW:[id number] DOB: May 28, 1950 DOA: 10/21/2023  PCP: Myra Geni ORN, FNP  Admit date: 10/21/2023 Discharge date: 10/23/2023  Admitted From: Home Discharge disposition: Home   Recommendations for Outpatient Follow-Up:   BMP 1 week, close follow-up with nephrology  Discharge Diagnosis:   Principal Problem:   Atypical chest pain Active Problems:   Essential hypertension   Elevated brain natriuretic peptide (BNP) level   Elevated troponin   Prolonged QT interval   Acute kidney injury superimposed on stage 5 chronic kidney disease, not on chronic dialysis (HCC)   Metabolic acidosis   Mixed hyperlipidemia   BPH (benign prostatic hyperplasia)   Depression    Discharge Condition: Improved.  Diet recommendation: Renal diet  Wound care: None.  Code status: Full.   History of Present Illness:   Bryan Manning. is a 73 y.o. male with medical history significant of hypertension, hyperlipidemia, depression, CKD stage 4, BPH who presents to the emergency department due to chest pain and shortness of breath.  He complained of difficulty in laying flat in bed due to worsening shortness of breath and increased chest pressure which improves on sitting up.  He also complained of shortness of breath on minimal exertion.  He complained of several weeks of worsening bilateral leg swelling despite being on Lasix  10 mg daily.  He endorsed a history of CKD with a right upper extremity fistula, but he has not started dialysis.  He looked up his symptoms on [facility] and it was suggested he may be having CHF and was advised to go to ED for further evaluation.   ED Course: In the emergency department, BP was 145/54, other vital signs were within normal range.  Workup in ED showed normocytic anemia, BMP was normal except for bicarb of 16 and blood glucose of 100, BUN/creatinine 81/5.35 (baseline at 2.2-2.7).  BNP 192, troponin 48 > 46. Chest  x-ray showed emphysematous  changes and chronic bronchitic changes and fibrosis in the lungs. Interstitial changes are somewhat increased which may indicate developing interstitial process since prior study. Small bilateral pleural effusions. Nitroglycerin  ointment was given, IV Lasix  40 mg x 1 was given.  TR[facility] was asked to admit patient   Hospital Course by Problem:   Atypical chest pain -Cardiology consult:Echocardiogram showed a preserved EF of 55 to 60% with mild LVH, grade 1 diastolic dysfunction, normal RV function and mild aortic valve stenosis. Diuretic therapy has been deferred to Nephrology in the setting of his acute on chronic Stage 5 CKD. No additional cardiac recommendations at this time. Will arrange for Cardiology follow-up in several months for reassessment. AS will be followed as an outpatient.    Elevated BNP-suspect due to grade 1 diastolic dysfunction BNP 192 -Lasix  per nephrology   Elevated troponin - Suspect due to kidney disease    Prolonged QT interval Avoid QT prolonging drugs   Acute kidney injury superimposed on CKD stage 5 (baseline at 2.2-2.7) Neurology consulted:Progressive CKD now at stage V - he is without uremic symptoms.  He has had significant decline in his renal function over the past 3 months which likely caused acute CHF.  He has responded well to IV lasix  with resolution.  Scr is up.  Will stop IV lasix  and start po 40 mg bid and follow UOP and Scr.  No urgent indication for RRT at this time, however he is nearing the need to start if his BUN/Cr continue to climb.  If his BUN/Cr stabilize on po lasix , he could be discharged over the weekend and follow up with his primary nephrologist.        Essential hypertension P.o. Lasix  increased with improvement in creatinine   Mixed hyperlipidemia Continue Lipitor    BPH Continue Flomax    Depression Continue Prozac     Medical Consultants:   Cardiology Nephrology   Discharge Exam:   Vitals:    10/22/23 2002 10/23/23 0452  BP: (!) 136/51 (!) 150/69  Pulse: 81 78  Resp: 18 18  Temp: 97.9 F (36.6 C) 97.8 F (36.6 C)  SpO2: 97% 98%   Vitals:   10/22/23 0914 10/22/23 1517 10/22/23 2002 10/23/23 0452  BP: (!) 135/50 (!) 129/51 (!) 136/51 (!) 150/69  Pulse:  73 81 78  Resp:  18 18 18   Temp:  98 F (36.7 C) 97.9 F (36.6 C) 97.8 F (36.6 C)  TempSrc:  Oral    SpO2:  98% 97% 98%  Weight:    85.1 kg  Height:        General exam: Appears calm and comfortable.    The results of significant diagnostics from this hospitalization (including imaging, microbiology, ancillary and laboratory) are listed below for reference.     Procedures and Diagnostic Studies:   ECHOCARDIOGRAM COMPLETE Result Date: 10/21/2023    ECHOCARDIOGRAM REPORT   Patient Name:   Bryan Manning. Date of Exam: 10/21/2023 Medical Rec #:  [id number]          Height:       74.0 in Accession #:    [id number]         Weight:       191.0 lb Date of Birth:  [birth year]         BSA:          2.131 m Patient Age:    73 years           BP:           151/68 mmHg Patient Gender: M                  HR:           91 bpm. Exam Location:  [facility] Procedure: 2D Echo, Cardiac Doppler and Color Doppler (Both Spectral and Color            Flow Doppler were utilized during procedure). Indications:    CHF-Acute Diastolic I50.31  History:        Patient has no prior history of Echocardiogram examinations.                 Risk Factors:Hypertension, Diabetes and Dyslipidemia.  Sonographer:    Aida Pizza RCS Referring Phys: [id number] OLADAPO ADEFESO IMPRESSIONS  1. Left ventricular ejection fraction, by estimation, is 55 to 60%. The left ventricle has normal function. The left ventricle has no regional wall motion abnormalities. There is mild left ventricular hypertrophy. Left ventricular diastolic parameters are consistent with Grade I diastolic dysfunction (impaired relaxation). Elevated left ventricular end-diastolic pressure.  2.  Right ventricular systolic function is normal. The right ventricular size is normal. Tricuspid regurgitation signal is inadequate for assessing PA pressure.  3. Left atrial size was severely dilated.  4. Right atrial size was moderately dilated.  5. The mitral valve is normal in structure. Trivial mitral valve regurgitation. No evidence of mitral stenosis.  6. The aortic valve is tricuspid. Aortic valve regurgitation is not visualized. Mild aortic valve stenosis. Aortic valve  Vmax measures 2.15 m/s.  7. The inferior vena cava is dilated in size with >50% respiratory variability, suggesting right atrial pressure of 8 mmHg.  8. Increased flow velocities may be secondary to anemia, thyrotoxicosis, hyperdynamic or high flow state. Comparison(s): No prior Echocardiogram. FINDINGS  Left Ventricle: Left ventricular ejection fraction, by estimation, is 55 to 60%. The left ventricle has normal function. The left ventricle has no regional wall motion abnormalities. Strain was performed and the global longitudinal strain is indeterminate. The left ventricular internal cavity size was normal in size. There is mild left ventricular hypertrophy. Left ventricular diastolic parameters are consistent with Grade I diastolic dysfunction (impaired relaxation). Elevated left ventricular end-diastolic pressure. Right Ventricle: The right ventricular size is normal. No increase in right ventricular wall thickness. Right ventricular systolic function is normal. Tricuspid regurgitation signal is inadequate for assessing PA pressure. Left Atrium: Left atrial size was severely dilated. Right Atrium: Right atrial size was moderately dilated. Pericardium: There is no evidence of pericardial effusion. Mitral Valve: The mitral valve is normal in structure. Trivial mitral valve regurgitation. No evidence of mitral valve stenosis. Tricuspid Valve: The tricuspid valve is normal in structure. Tricuspid valve regurgitation is trivial. No evidence of  tricuspid stenosis. Aortic Valve: The aortic valve is tricuspid. Aortic valve regurgitation is not visualized. Mild aortic stenosis is present. Aortic valve peak gradient measures 18.5 mmHg. Pulmonic Valve: The pulmonic valve was not well visualized. Pulmonic valve regurgitation is trivial. No evidence of pulmonic stenosis. Aorta: The aortic root is normal in size and structure. Venous: The inferior vena cava is dilated in size with greater than 50% respiratory variability, suggesting right atrial pressure of 8 mmHg. IAS/Shunts: No atrial level shunt detected by color flow Doppler. Additional Comments: 3D was performed not requiring image post processing on an independent workstation and was indeterminate.  LEFT VENTRICLE PLAX 2D LVIDd:         4.60 cm   Diastology LVIDs:         3.00 cm   LV e' medial:    9.11 cm/s LV PW:         1.20 cm   LV E/e' medial:  15.6 LV IVS:        1.20 cm   LV e' lateral:   9.36 cm/s LVOT diam:     2.10 cm   LV E/e' lateral: 15.2 LV SV:         112 LV SV Index:   52 LVOT Area:     3.46 cm  RIGHT VENTRICLE RV S prime:     25.90 cm/s TAPSE (M-mode): 3.1 cm LEFT ATRIUM              Index        RIGHT ATRIUM           Index LA diam:        4.60 cm  2.16 cm/m   RA Area:     25.30 cm LA Vol (A2C):   132.0 ml 61.93 ml/m  RA Volume:   84.20 ml  39.51 ml/m LA Vol (A4C):   81.7 ml  38.33 ml/m LA Biplane Vol: 104.0 ml 48.80 ml/m  AORTIC VALVE AV Area (Vmax): 2.48 cm AV Vmax:        215.00 cm/s AV Peak Grad:   18.5 mmHg LVOT Vmax:      154.00 cm/s LVOT Vmean:     106.000 cm/s LVOT VTI:       0.323 m  AORTA Ao  Root diam: 3.60 cm MITRAL VALVE MV Area (PHT): 4.60 cm     SHUNTS MV Decel Time: 165 msec     Systemic VTI:  0.32 m MV E velocity: 142.00 cm/s  Systemic Diam: 2.10 cm MV A velocity: 163.00 cm/s MV E/A ratio:  0.87 Vishnu Priya Mallipeddi Electronically signed by Diannah Late Mallipeddi Signature Date/Time: 10/21/2023/3:46:40 PM    Final    DG Chest Portable 1 View Result Date:  10/21/2023 CLINICAL DATA:  Chest pain and unusual pressure. EXAM: PORTABLE CHEST 1 VIEW COMPARISON:  04/16/2023 FINDINGS: Heart size and pulmonary vascularity are normal for technique. Emphysematous changes in the lungs. Diffuse interstitial pattern suggesting fibrosis. Peribronchial thickening and streaky perihilar opacities suggesting bronchitis or airways disease. Interstitial changes are somewhat more prominent than on prior study which could indicate mild superimposed edema or acute airways disease. Small bilateral pleural effusions. No pneumothorax. Mediastinal contours appear intact. Postoperative changes in the right shoulder. IMPRESSION: 1. Emphysematous changes and chronic bronchitic changes and fibrosis in the lungs. 2. Interstitial changes are somewhat increased which may indicate developing interstitial process since prior study. Small bilateral pleural effusions. Electronically Signed   By: Elsie Gravely M.D.   On: 10/21/2023 00:26     Labs:   Basic Metabolic Panel: Recent Labs  Lab 10/21/23 0008 10/21/23 0200 10/22/23 0431 10/23/23 0305  NA 136  --  136 137  K 3.9  --  3.6 3.8  CL 106  --  107 103  CO2 16*  --  19* 20*  GLUCOSE 108*  --  117* 108*  BUN 81*  --  85* 86*  CREATININE 5.35*  --  5.90* 5.82*  CALCIUM  9.1  --  8.7* 8.9  MG  --  2.6*  --   --   PHOS  --  7.1*  --   --    GFR Estimated Creatinine Clearance: 13.7 mL/min (A) (by C-G formula based on SCr of 5.82 mg/dL (H)). Liver Function Tests: Recent Labs  Lab 10/22/23 0431  AST 15  ALT 15  ALKPHOS 53  BILITOT 0.5  PROT 5.6*  ALBUMIN 3.0*   No results for input(s): LIPASE, AMYLASE in the last 168 hours. No results for input(s): AMMONIA in the last 168 hours. Coagulation profile No results for input(s): INR, PROTIME in the last 168 hours.  CBC: Recent Labs  Lab 10/21/23 0008 10/22/23 0431 10/23/23 0305  WBC 9.3 7.1 7.2  NEUTROABS 6.9  --   --   HGB 9.3* 7.9* 8.8*  HCT 27.8* 25.3*  26.7*  MCV 97.2 98.4 97.1  PLT 291 223 256   Cardiac Enzymes: No results for input(s): CKTOTAL, CKMB, CKMBINDEX, TROPONINI in the last 168 hours. BNP: Invalid input(s): POCBNP CBG: No results for input(s): GLUCAP in the last 168 hours. D-Dimer No results for input(s): DDIMER in the last 72 hours. Hgb A1c No results for input(s): HGBA1C in the last 72 hours. Lipid Profile No results for input(s): CHOL, HDL, LDLCALC, TRIG, CHOLHDL, LDLDIRECT in the last 72 hours. Thyroid  function studies No results for input(s): TSH, T4TOTAL, T3FREE, THYROIDAB in the last 72 hours.  Invalid input(s): FREET3 Anemia work up Recent Labs    10/21/23 1243  TIBC 276  IRON 69   Microbiology No results found for this or any previous visit (from the past 240 hours).   Discharge Instructions:   Discharge Instructions     Discharge instructions   Complete by: As directed    Renal diet BMP 1 week  Close follow up with your nephrologist   Increase activity slowly   Complete by: As directed    No wound care   Complete by: As directed       Allergies as of 10/23/2023       Reactions   Iodinated Contrast Media Hives, Itching   Oxytetracycline Shortness Of Breath, Rash   TERRAMYCIN - Respiratory arrest   Ropinirole     Confusion (intolerance), Memory impairment   Silicone Hives, Rash   Tape Hives, Rash   Amoxicillin Nausea Only   Tetracycline    Respiratory arrest (Terramycin)    Nsaids Itching, Nausea And Vomiting, Other (See Comments)   Hx gastric bypass can not take nsaids        Medication List     TAKE these medications    acetaminophen  500 MG tablet Commonly known as: TYLENOL  Take 1,000 mg by mouth every 6 (six) hours as needed for moderate pain (pain score 4-6).   amLODipine  5 MG tablet Commonly known as: NORVASC  Take 5 mg by mouth daily.   ARIPiprazole  20 MG tablet Commonly known as: ABILIFY  Take 20 mg by mouth daily.    atorvastatin  10 MG tablet Commonly known as: LIPITOR Take 10 mg by mouth daily.   calcitRIOL 0.5 MCG capsule Commonly known as: ROCALTROL Take 0.5 mcg by mouth daily.   FLUoxetine  40 MG capsule Commonly known as: PROZAC  Take 40 mg by mouth daily.   furosemide  40 MG tablet Commonly known as: LASIX  Take 1 tablet (40 mg total) by mouth 2 (two) times daily. What changed:  medication strength how much to take when to take this   hydrALAZINE 25 MG tablet Commonly known as: APRESOLINE Take 25 mg by mouth 3 (three) times daily.   loratadine 10 MG tablet Commonly known as: CLARITIN Take 10 mg by mouth daily.   pantoprazole  40 MG tablet Commonly known as: PROTONIX  Take 40 mg by mouth daily.   tamsulosin  0.4 MG Caps capsule Commonly known as: FLOMAX  Take 0.8 mg by mouth daily.          Time coordinating discharge: 45 min  Signed:  Harlene RAYMOND Bowl DO  [facility] 10/23/2023, 8:20 AM

## 2023-10-23 NOTE — Plan of Care (Signed)
   Problem: Education: Goal: Knowledge of General Education information will improve Description: Including pain rating scale, medication(s)/side effects and non-pharmacologic comfort measures Outcome: Progressing   Problem: Health Behavior/Discharge Planning: Goal: Ability to manage health-related needs will improve Outcome: Progressing   Problem: Clinical Measurements: Goal: Will remain free from infection Outcome: Progressing

## 2023-11-24 ENCOUNTER — Other Ambulatory Visit: Payer: Self-pay | Admitting: Medical Genetics

## 2023-11-25 ENCOUNTER — Other Ambulatory Visit (HOSPITAL_COMMUNITY)
Admission: RE | Admit: 2023-11-25 | Discharge: 2023-11-25 | Disposition: A | Discharge status: Home / Self Care | Payer: Self-pay | Source: Ambulatory Visit | Attending: Medical Genetics | Admitting: Medical Genetics

## 2023-12-04 LAB — GENECONNECT MOLECULAR SCREEN: Genetic Analysis Overall Interpretation: NEGATIVE

## 2023-12-12 ENCOUNTER — Encounter (HOSPITAL_COMMUNITY): Payer: Self-pay

## 2023-12-12 ENCOUNTER — Other Ambulatory Visit: Payer: Self-pay

## 2023-12-12 ENCOUNTER — Emergency Department (HOSPITAL_COMMUNITY): Admission: EM | Admit: 2023-12-12 | Discharge: 2023-12-12 | Disposition: A | Discharge status: Home / Self Care

## 2023-12-12 DIAGNOSIS — N186 End stage renal disease: Secondary | ICD-10-CM | POA: Insufficient documentation

## 2023-12-12 DIAGNOSIS — I770 Arteriovenous fistula, acquired: Secondary | ICD-10-CM | POA: Insufficient documentation

## 2023-12-12 DIAGNOSIS — Z79899 Other long term (current) drug therapy: Secondary | ICD-10-CM | POA: Diagnosis not present

## 2023-12-12 DIAGNOSIS — I120 Hypertensive chronic kidney disease with stage 5 chronic kidney disease or end stage renal disease: Secondary | ICD-10-CM | POA: Insufficient documentation

## 2023-12-12 NOTE — ED Provider Notes (Signed)
 [place] EMERGENCY DEPARTMENT AT [facility] Provider Note   CSN: [id number] Arrival date & time: 12/12/23  2202     Patient presents with: Vascular Access Problem   Bryan Manning. is a 73 y.o. male.   73 year old male with past medical history of hypertension and end-stage renal disease on dialysis presenting to the emergency department today with concern that he was having a difficult time feeling the thrill on his fistula.  The patient started dialysis recently.  He denies any associated pain.  He states that he had a failed fistula in the left upper extremity so he does worry about his fistula on the right.  He reports that he did have dialysis on Friday and did not have any issues.        Prior to Admission medications   Medication Sig Start Date End Date Taking? Authorizing Provider  acetaminophen  (TYLENOL ) 500 MG tablet Take 1,000 mg by mouth every 6 (six) hours as needed for moderate pain (pain score 4-6).    [provider]  amLODipine  (NORVASC ) 5 MG tablet Take 5 mg by mouth daily. 05/13/22   [provider]  ARIPiprazole  (ABILIFY ) 20 MG tablet Take 20 mg by mouth daily. 05/21/22   [provider]  atorvastatin  (LIPITOR) 10 MG tablet Take 10 mg by mouth daily. 09/09/22   [provider]  calcitRIOL (ROCALTROL) 0.5 MCG capsule Take 0.5 mcg by mouth daily. 12/15/22   [provider]  FLUoxetine  (PROZAC ) 40 MG capsule Take 40 mg by mouth daily. 05/21/22   [provider]  furosemide  (LASIX ) 40 MG tablet Take 1 tablet (40 mg total) by mouth 2 (two) times daily. 10/23/23   Vann, Jessica U, DO  hydrALAZINE (APRESOLINE) 25 MG tablet Take 25 mg by mouth 3 (three) times daily. 06/10/23 02/23/25  [provider]  loratadine (CLARITIN) 10 MG tablet Take 10 mg by mouth daily. 09/09/23   [provider]  pantoprazole  (PROTONIX ) 40 MG tablet Take 40 mg by mouth daily. 04/22/23   [provider]   tamsulosin  (FLOMAX ) 0.4 MG CAPS capsule Take 0.8 mg by mouth daily.    [provider]    Allergies: Iodinated contrast media, Oxytetracycline, Ropinirole, Silicone, Tape, Amoxicillin, Tetracycline, and Nsaids    Review of Systems  Cardiovascular:        Diminished thrill of AVF  All other systems reviewed and are negative.   Updated Vital Signs BP (!) 149/61 (BP Location: Left Arm)   Pulse 68   Temp 98.4 F (36.9 C) (Oral)   Resp 18   Ht 6' 2 (1.88 m)   Wt 84.8 kg   SpO2 100%   BMI 24.01 kg/m   Physical Exam Vitals and nursing note reviewed.   General: No acute distress Vascular: The patient does have an easily palpable thrill of the fistula on the right upper extremity and mild bruit on auscultation  (all labs ordered are listed, but only abnormal results are displayed) Labs Reviewed - No data to display  EKG: None  Radiology: No results found.   Procedures   Medications Ordered in the ED - No data to display                                  Medical Decision Making 73 year old male with past medical history of hypertension and end-stage renal disease presenting to the emergency department today with concern  for diminished thrill in his AV fistula.  I am easily able to palpate a thrill and he has a strong bruit on auscultation here.  The patient was palpating the fistula relatively hard and I think this is the reason he was unable to fill this.  He has dialysis scheduled tomorrow.  I think that he is stable for discharge for dialysis tomorrow as there are no indications of diminished blood flow to his fistula.  Encouraged him to follow-up with dialysis and if he were to note that this worsens to go to [facility] overnight for ultrasound and further evaluation but at this time I do not think that this is indicated.        Final diagnoses:  AV fistula [facility])    ED Discharge Orders     None          Ula Prentice SAUNDERS, MD 12/12/23 2224

## 2023-12-12 NOTE — Discharge Instructions (Signed)
 I was able to feel a thrill on your AV fistula and hear a bruit with the stethoscope.  I think that the blood flow seems to be normal to me here.  Please follow-up tomorrow for dialysis.  If you do develop any more concerns that the blood flow is diminished please go to [facility] overnight where they do have 24-hour a day ultrasound services.

## 2023-12-12 NOTE — ED Triage Notes (Addendum)
 Pt to ED with c/o no thrill to dialysis shunt to right arm, reports that he just noticed this tonight. Pt last dialysis was Friday, reports he has only been doing dialysis for 3-4 weeks now

## 2024-01-13 ENCOUNTER — Ambulatory Visit: Payer: Medicare Other | Admitting: Dermatology

## 2024-01-13 ENCOUNTER — Encounter: Payer: Self-pay | Admitting: Dermatology

## 2024-01-13 DIAGNOSIS — W908XXA Exposure to other nonionizing radiation, initial encounter: Secondary | ICD-10-CM

## 2024-01-13 DIAGNOSIS — L578 Other skin changes due to chronic exposure to nonionizing radiation: Secondary | ICD-10-CM | POA: Diagnosis not present

## 2024-01-13 DIAGNOSIS — L820 Inflamed seborrheic keratosis: Secondary | ICD-10-CM

## 2024-01-13 DIAGNOSIS — D225 Melanocytic nevi of trunk: Secondary | ICD-10-CM

## 2024-01-13 DIAGNOSIS — L821 Other seborrheic keratosis: Secondary | ICD-10-CM

## 2024-01-13 DIAGNOSIS — D2261 Melanocytic nevi of right upper limb, including shoulder: Secondary | ICD-10-CM

## 2024-01-13 DIAGNOSIS — L814 Other melanin hyperpigmentation: Secondary | ICD-10-CM

## 2024-01-13 DIAGNOSIS — D692 Other nonthrombocytopenic purpura: Secondary | ICD-10-CM

## 2024-01-13 DIAGNOSIS — Z1283 Encounter for screening for malignant neoplasm of skin: Secondary | ICD-10-CM

## 2024-01-13 DIAGNOSIS — Z85828 Personal history of other malignant neoplasm of skin: Secondary | ICD-10-CM

## 2024-01-13 DIAGNOSIS — D489 Neoplasm of uncertain behavior, unspecified: Secondary | ICD-10-CM | POA: Diagnosis not present

## 2024-01-13 DIAGNOSIS — L570 Actinic keratosis: Secondary | ICD-10-CM | POA: Diagnosis not present

## 2024-01-13 DIAGNOSIS — D229 Melanocytic nevi, unspecified: Secondary | ICD-10-CM

## 2024-01-13 DIAGNOSIS — D239 Other benign neoplasm of skin, unspecified: Secondary | ICD-10-CM

## 2024-01-13 HISTORY — DX: Other benign neoplasm of skin, unspecified: D23.9

## 2024-01-13 NOTE — Patient Instructions (Addendum)
 If spot at right jaw has not gone away in 2 months give send mychart or call will need to recheck area    Biopsy Wound Care Instructions  Leave the original bandage on for 24 hours if possible.  If the bandage becomes soaked or soiled before that time, it is OK to remove it and examine the wound.  A small amount of post-operative bleeding is normal.  If excessive bleeding occurs, remove the bandage, place gauze over the site and apply continuous pressure (no peeking) over the area for 30 minutes. If this does not work, please call our clinic as soon as possible or page your doctor if it is after hours.   Once a day, cleanse the wound with soap and water. It is fine to shower. If a thick crust develops you may use a Q-tip dipped into dilute hydrogen peroxide (mix 1:1 with water) to dissolve it.  Hydrogen peroxide can slow the healing process, so use it only as needed.    After washing, apply petroleum jelly (Vaseline) or an antibiotic ointment if your doctor prescribed one for you, followed by a bandage.    For best healing, the wound should be covered with a layer of ointment at all times. If you are not able to keep the area covered with a bandage to hold the ointment in place, this may mean re-applying the ointment several times a day.  Continue this wound care until the wound has healed and is no longer open.   Itching and mild discomfort is normal during the healing process. However, if you develop pain or severe itching, please call our office.   If you have any discomfort, you can take Tylenol  (acetaminophen ) or ibuprofen as directed on the bottle. (Please do not take these if you have an allergy to them or cannot take them for another reason).  Some redness, tenderness and white or yellow material in the wound is normal healing.  If the area becomes very sore and red, or develops a thick yellow-green material (pus), it may be infected; please notify us .    If you have stitches, return to  clinic as directed to have the stitches removed. You will continue wound care for 2-3 days after the stitches are removed.   Wound healing continues for up to one year following surgery. It is not unusual to experience pain in the scar from time to time during the interval.  If the pain becomes severe or the scar thickens, you should notify the office.    A slight amount of redness in a scar is expected for the first six months.  After six months, the redness will fade and the scar will soften and fade.  The color difference becomes less noticeable with time.  If there are any problems, return for a post-op surgery check at your earliest convenience.  To improve the appearance of the scar, you can use silicone scar gel, cream, or sheets (such as Mederma or Serica) every night for up to one year. These are available over the counter (without a prescription).  Please call our office at [phone] for any questions or concerns.       Seborrheic Keratosis  What causes seborrheic keratoses? Seborrheic keratoses are harmless, common skin growths that first appear during adult life.  As time goes by, more growths appear.  Some people may develop a large number of them.  Seborrheic keratoses appear on both covered and uncovered body parts.  They are not caused  by sunlight.  The tendency to develop seborrheic keratoses can be inherited.  They vary in color from skin-colored to gray, brown, or even black.  They can be either smooth or have a rough, warty surface.   Seborrheic keratoses are superficial and look as if they were stuck on the skin.  Under the microscope this type of keratosis looks like layers upon layers of skin.  That is why at times the top layer may seem to fall off, but the rest of the growth remains and re-grows.    Treatment Seborrheic keratoses do not need to be treated, but can easily be removed in the office.  Seborrheic keratoses often cause symptoms when they rub on clothing  or jewelry.  Lesions can be in the way of shaving.  If they become inflamed, they can cause itching, soreness, or burning.  Removal of a seborrheic keratosis can be accomplished by freezing, burning, or surgery. If any spot bleeds, scabs, or grows rapidly, please return to have it checked, as these can be an indication of a skin cancer.   Cryotherapy Aftercare  Wash gently with soap and water everyday.   Apply Vaseline and Band-Aid daily until healed.    Actinic keratoses are precancerous spots that appear secondary to cumulative UV radiation exposure/sun exposure over time. They are chronic with expected duration over 1 year. A portion of actinic keratoses will progress to squamous cell carcinoma of the skin. It is not possible to reliably predict which spots will progress to skin cancer and so treatment is recommended to prevent development of skin cancer.  Recommend daily broad spectrum sunscreen SPF 30+ to sun-exposed areas, reapply every 2 hours as needed.  Recommend staying in the shade or wearing long sleeves, sun glasses (UVA+UVB protection) and wide brim hats (4-inch brim around the entire circumference of the hat). Call for new or changing lesions.     Melanoma ABCDEs  Melanoma is the most dangerous type of skin cancer, and is the leading cause of death from skin disease.  You are more likely to develop melanoma if you: Have light-colored skin, light-colored eyes, or red or blond hair Spend a lot of time in the sun Tan regularly, either outdoors or in a tanning bed Have had blistering sunburns, especially during childhood Have a close family member who has had a melanoma Have atypical moles or large birthmarks  Early detection of melanoma is key since treatment is typically straightforward and cure rates are extremely high if we catch it early.   The first sign of melanoma is often a change in a mole or a new dark spot.  The ABCDE system is a way of remembering the signs of  melanoma.  A for asymmetry:  The two halves do not match. B for border:  The edges of the growth are irregular. C for color:  A mixture of colors are present instead of an even brown color. D for diameter:  Melanomas are usually (but not always) greater than 6mm - the size of a pencil eraser. E for evolution:  The spot keeps changing in size, shape, and color.  Please check your skin once per month between visits. You can use a small mirror in front and a large mirror behind you to keep an eye on the back side or your body.   If you see any new or changing lesions before your next follow-up, please call to schedule a visit.  Please continue daily skin protection including broad spectrum sunscreen  SPF 30+ to sun-exposed areas, reapplying every 2 hours as needed when you're outdoors.   Staying in the shade or wearing long sleeves, sun glasses (UVA+UVB protection) and wide brim hats (4-inch brim around the entire circumference of the hat) are also recommended for sun protection.    Due to recent changes in healthcare laws, you may see results of your pathology and/or laboratory studies on MyChart before the doctors have had a chance to review them. We understand that in some cases there may be results that are confusing or concerning to you. Please understand that not all results are received at the same time and often the doctors may need to interpret multiple results in order to provide you with the best plan of care or course of treatment. Therefore, we ask that you please give us  2 business days to thoroughly review all your results before contacting the office for clarification. Should we see a critical lab result, you will be contacted sooner.   If You Need Anything After Your Visit  If you have any questions or concerns for your doctor, please call our main line at [phone] and press option 4 to reach your doctor's medical assistant. If no one answers, please leave a voicemail as  directed and we will return your call as soon as possible. Messages left after 4 pm will be answered the following business day.   You may also send us  a message via MyChart. We typically respond to MyChart messages within 1-2 business days.  For prescription refills, please ask your pharmacy to contact our office. Our fax number is [phone].  If you have an urgent issue when the clinic is closed that cannot wait until the next business day, you can page your doctor at the number below.    Please note that while we do our best to be available for urgent issues outside of office hours, we are not available 24/7.   If you have an urgent issue and are unable to reach us , you may choose to seek medical care at your doctor's office, retail clinic, urgent care center, or emergency room.  If you have a medical emergency, please immediately call 911 or go to the emergency department.  Pager Numbers  - Dr. Hester: [phone]  - Dr. Jackquline: [phone]  - Dr. Claudene: [phone]   - Dr. Raymund: [phone]  In the event of inclement weather, please call our main line at [phone] for an update on the status of any delays or closures.  Dermatology Medication Tips: Please keep the boxes that topical medications come in in order to help keep track of the instructions about where and how to use these. Pharmacies typically print the medication instructions only on the boxes and not directly on the medication tubes.   If your medication is too expensive, please contact our office at [phone] option 4 or send us  a message through MyChart.   We are unable to tell what your co-pay for medications will be in advance as this is different depending on your insurance coverage. However, we may be able to find a substitute medication at lower cost or fill out paperwork to get insurance to cover a needed medication.   If a prior authorization is required to get your medication covered by your  insurance company, please allow us  1-2 business days to complete this process.  Drug prices often vary depending on where the prescription is filled and some pharmacies may offer cheaper prices.  The website www.goodrx.com  contains coupons for medications through different pharmacies. The prices here do not account for what the cost may be with help from insurance (it may be cheaper with your insurance), but the website can give you the price if you did not use any insurance.  - You can print the associated coupon and take it with your prescription to the pharmacy.  - You may also stop by our office during regular business hours and pick up a GoodRx coupon card.  - If you need your prescription sent electronically to a different pharmacy, notify our office through [facility] or by phone at [phone] option 4.     Si Usted Necesita Algo Despus de Su Visita  Tambin puede enviarnos un mensaje a travs de Clinical cytogeneticist. Por lo general respondemos a los mensajes de MyChart en el transcurso de 1 a 2 das hbiles.  Para renovar recetas, por favor pida a su farmacia que se ponga en contacto con nuestra oficina. Randi lakes de fax es [place] [phone].  Si tiene un asunto urgente cuando la clnica est cerrada y que no puede esperar hasta el siguiente da hbil, puede llamar/localizar a su doctor(a) al nmero que aparece a continuacin.   Por favor, tenga en cuenta que aunque hacemos todo lo posible para estar disponibles para asuntos urgentes fuera del horario de [place], no estamos disponibles las 24 horas del da, los 7 [address]  [address] de la [place].   Si tiene un problema urgente y no puede comunicarse con nosotros, puede optar por buscar atencin mdica  en el consultorio de su doctor(a), en una clnica privada, en un centro de atencin urgente o en una sala de emergencias.  Si tiene Engineer, drilling, por favor llame inmediatamente al 911 o vaya a la sala de emergencias.  Nmeros de  bper  - Dr. Hester: [phone]  - Dra. Jackquline: [phone]  - Dr. Claudene: [phone]  - Dra. Kitts: [phone]  En caso de inclemencias del [place], por favor llame a nuestra lnea principal al [phone] para una actualizacin sobre el estado de cualquier retraso o cierre.  Consejos para la medicacin en dermatologa: Por favor, guarde las cajas en las que vienen los medicamentos de uso tpico para ayudarle a seguir las instrucciones sobre dnde y cmo usarlos. Las farmacias generalmente imprimen las instrucciones del medicamento slo en las cajas y no directamente en los tubos del [place].   Si su medicamento es muy caro, por favor, pngase en contacto con landry rieger llamando al [phone] y presione la opcin 4 o envenos un mensaje a travs de Clinical cytogeneticist.   No podemos decirle cul ser su copago por los medicamentos por adelantado ya que esto es diferente dependiendo de la cobertura de su seguro. Sin embargo, es posible que podamos encontrar un medicamento sustituto a Audiological scientist un formulario para que el seguro cubra el medicamento que se considera necesario.   Si se requiere una autorizacin previa para que su compaa de seguros malta su medicamento, por favor permtanos de 1 a 2 das hbiles para completar este proceso.  Los precios de los medicamentos varan con frecuencia dependiendo del Environmental consultant de dnde se surte la receta y alguna farmacias pueden ofrecer precios ms baratos.  El sitio web www.goodrx.com tiene cupones para medicamentos de Health and safety inspector. Los precios aqu no tienen en cuenta lo que podra costar con la ayuda del seguro (puede ser ms barato con su seguro), pero el sitio web puede darle el precio si no utiliz Tourist information centre manager.  -  Puede imprimir el cupn correspondiente y llevarlo con su receta a la farmacia.  - Tambin puede pasar por nuestra oficina durante el horario de atencin regular y Education officer, museum una tarjeta de cupones de GoodRx.  -  Si necesita que su receta se enve electrnicamente a una farmacia diferente, informe a nuestra oficina a travs de MyChart de [place] o por telfono llamando al [phone] y presione la opcin 4.

## 2024-01-13 NOTE — Progress Notes (Signed)
 Follow-Up Visit   Subjective  Bryan Manning. is a 73 y.o. male who presents for the following: Skin Cancer Screening and Full Body Skin Exam Hx of bcc, hx of aks, hx of isks, hx of eczema at lower legs reports that he doesn't need treatment, has used mometasone  cream in the past.  Noticed spot at right jaw several months ago  The patient presents for Total-Body Skin Exam (TBSE) for skin cancer screening and mole check. The patient has spots, moles and lesions to be evaluated, some may be new or changing and the patient may have concern these could be cancer.  The following portions of the chart were reviewed this encounter and updated as appropriate: medications, allergies, medical history  Review of Systems:  No other skin or systemic complaints except as noted in HPI or Assessment and Plan.  Objective  Well appearing patient in no apparent distress; mood and affect are within normal limits.  A full examination was performed including scalp, head, eyes, ears, nose, lips, neck, chest, axillae, abdomen, back, buttocks, bilateral upper extremities, bilateral lower extremities, hands, feet, fingers, toes, fingernails, and toenails. All findings within normal limits unless otherwise noted below.   Relevant physical exam findings are noted in the Assessment and Plan.  scalp and face x 9 (9) Erythematous thin papules/macules with gritty scale.  right superior scapula 0.3 cm dark brown macule   left flank / side waistline 0.6 cm brown macule   right forehead x 1, right neck at proximal mandible x 1 (2) Erythematous stuck-on, waxy papule or plaque  Assessment & Plan   SKIN CANCER SCREENING PERFORMED TODAY.  ACTINIC DAMAGE - Chronic condition, secondary to cumulative UV/sun exposure - diffuse scaly erythematous macules with underlying dyspigmentation - Recommend daily broad spectrum sunscreen SPF 30+ to sun-exposed areas, reapply every 2 hours as needed.  - Staying in the shade  or wearing long sleeves, sun glasses (UVA+UVB protection) and wide brim hats (4-inch brim around the entire circumference of the hat) are also recommended for sun protection.  - Call for new or changing lesions.  LENTIGINES, SEBORRHEIC KERATOSES, HEMANGIOMAS - Benign normal skin lesions - Benign-appearing - Call for any changes  MELANOCYTIC NEVI - Tan-brown and/or pink-flesh-colored symmetric macules and papules - Benign appearing on exam today - Observation - Call clinic for new or changing moles - Recommend daily use of broad spectrum spf 30+ sunscreen to sun-exposed areas.   Purpura - Chronic; persistent and recurrent.  Treatable, but not curable. - Violaceous macules and patches - Benign - Related to trauma, age, sun damage and/or use of blood thinners, chronic use of topical and/or oral steroids - Observe - Can use OTC arnica containing moisturizer such as Dermend Bruise Formula if desired - Call for worsening or other concerns  HISTORY OF BASAL CELL CARCINOMA OF THE SKIN 03/18/2022 left forearm ED&C , right forearm ED&C  Hx of bcc at back treated in [place] - No evidence of recurrence today - Recommend regular full body skin exams - Recommend daily broad spectrum sunscreen SPF 30+ to sun-exposed areas, reapply every 2 hours as needed.  - Call if any new or changing lesions are noted between office visits  Kidney Disease  Pt currently on hemodyalysis and kidney transplant wait list   ACTINIC KERATOSIS (9) scalp and face x 9 (9) Actinic keratoses are precancerous spots that appear secondary to cumulative UV radiation exposure/sun exposure over time. They are chronic with expected duration over 1 year. A portion  of actinic keratoses will progress to squamous cell carcinoma of the skin. It is not possible to reliably predict which spots will progress to skin cancer and so treatment is recommended to prevent development of skin cancer.  Recommend daily broad spectrum sunscreen  SPF 30+ to sun-exposed areas, reapply every 2 hours as needed.  Recommend staying in the shade or wearing long sleeves, sun glasses (UVA+UVB protection) and wide brim hats (4-inch brim around the entire circumference of the hat). Call for new or changing lesions. Destruction of lesion - scalp and face x 9 (9) Complexity: simple   Destruction method: cryotherapy   Informed consent: discussed and consent obtained   Timeout:  patient name, date of birth, surgical site, and procedure verified Lesion destroyed using liquid nitrogen: Yes   Region frozen until ice ball extended beyond lesion: Yes   Outcome: patient tolerated procedure well with no complications   Post-procedure details: wound care instructions given    NEOPLASM OF UNCERTAIN BEHAVIOR (2) right superior scapula Epidermal / dermal shaving  Lesion diameter (cm):  0.3 Informed consent: discussed and consent obtained   Timeout: patient name, date of birth, surgical site, and procedure verified   Procedure prep:  Patient was prepped and draped in usual sterile fashion Prep type:  Isopropyl alcohol Anesthesia: the lesion was anesthetized in a standard fashion   Anesthetic:  1% lidocaine  w/ epinephrine 1-100,000 buffered w/ 8.4% NaHCO3 Instrument used: flexible razor blade   Hemostasis achieved with: pressure, aluminum chloride and electrodesiccation   Outcome: patient tolerated procedure well   Post-procedure details: sterile dressing applied and wound care instructions given   Dressing type: bandage and petrolatum    Specimen 1 - Surgical pathology Differential Diagnosis: nevus r/o dysplasia vs ca  Check Margins: yes left flank / side waistline Epidermal / dermal shaving  Lesion diameter (cm):  0.6 Informed consent: discussed and consent obtained   Timeout: patient name, date of birth, surgical site, and procedure verified   Procedure prep:  Patient was prepped and draped in usual sterile fashion Prep type:  Isopropyl  alcohol Anesthesia: the lesion was anesthetized in a standard fashion   Anesthetic:  1% lidocaine  w/ epinephrine 1-100,000 buffered w/ 8.4% NaHCO3 Instrument used: flexible razor blade   Hemostasis achieved with: pressure, aluminum chloride and electrodesiccation   Outcome: patient tolerated procedure well   Post-procedure details: sterile dressing applied and wound care instructions given   Dressing type: bandage and petrolatum    Specimen 2 - Surgical pathology Differential Diagnosis: nevus r/o dysplasia vs ca  Check Margins: yes Nevus r/o dysplasia vs ca  INFLAMED SEBORRHEIC KERATOSIS (2) right forehead x 1, right neck at proximal mandible x 1 (2) Symptomatic, irritating, patient would like treated.  Patient advised if spot at right neck at proximal mandible doesn't go away in 2 months to call or send mychart will need to recheck Destruction of lesion - right forehead x 1, right neck at proximal mandible x 1 (2) Complexity: simple   Destruction method: cryotherapy   Informed consent: discussed and consent obtained   Timeout:  patient name, date of birth, surgical site, and procedure verified Lesion destroyed using liquid nitrogen: Yes   Region frozen until ice ball extended beyond lesion: Yes   Outcome: patient tolerated procedure well with no complications   Post-procedure details: wound care instructions given    Return in about 1 year (around 01/12/2025) for TBSE.  IEleanor Blush, CMA, am acting as scribe for Alm Rhyme, MD.  Documentation: I have reviewed the above documentation for accuracy and completeness, and I agree with the above.  Alm Rhyme, MD

## 2024-01-17 ENCOUNTER — Ambulatory Visit: Payer: Self-pay | Admitting: Dermatology

## 2024-01-17 LAB — SURGICAL PATHOLOGY

## 2024-01-18 ENCOUNTER — Encounter: Payer: Self-pay | Admitting: Dermatology

## 2024-01-18 NOTE — Telephone Encounter (Signed)
-----   Message from Alm Rhyme sent at 01/17/2024  7:02 PM EDT ----- FINAL DIAGNOSIS        1. Skin, right superior scapula :       DYSPLASTIC JUNCTIONAL LENTIGINOUS NEVUS WITH MODERATE ATYPIA, IRRITATED, LIMITED       MARGINS FREE        2. Skin, left flank / side waistline :       DYSPLASTIC COMPOUND NEVUS WITH MODERATE ATYPIA, LIMITED MARGINS FREE    1 & 2 - Both Moderate Dysplastic Recheck next visit ----- Message ----- From: Interface, Lab In Three Zero One Sent: 01/17/2024   4:49 PM EDT To: Alm JAYSON Rhyme, MD

## 2024-01-18 NOTE — Telephone Encounter (Signed)
 Discussed biopsy results with patient   Skin, right superior scapula :       DYSPLASTIC JUNCTIONAL LENTIGINOUS NEVUS WITH MODERATE ATYPIA, IRRITATED, LIMITED       MARGINS FREE         2. Skin, left flank / side waistline :       DYSPLASTIC COMPOUND NEVUS WITH MODERATE ATYPIA, LIMITED MARGINS FRE

## 2024-02-07 ENCOUNTER — Ambulatory Visit: Admitting: Internal Medicine

## 2024-03-21 ENCOUNTER — Ambulatory Visit: Attending: Internal Medicine | Admitting: Internal Medicine

## 2024-03-21 ENCOUNTER — Encounter: Payer: Self-pay | Admitting: Internal Medicine

## 2024-03-21 VITALS — BP 125/79 | HR 70 | Wt 207.0 lb

## 2024-03-21 DIAGNOSIS — I350 Nonrheumatic aortic (valve) stenosis: Secondary | ICD-10-CM | POA: Diagnosis not present

## 2024-03-21 NOTE — Patient Instructions (Signed)
 Medication Instructions:  Your physician recommends that you continue on your current medications as directed. Please refer to the Current Medication list given to you today.  *If you need a refill on your cardiac medications before your next appointment, please call your pharmacy*  Lab Work: None If you have labs (blood work) drawn today and your tests are completely normal, you will receive your results only by: MyChart Message (if you have MyChart) OR A paper copy in the mail If you have any lab test that is abnormal or we need to change your treatment, we will call you to review the results.  Testing/Procedures: None  Follow-Up: At [facility], you and your health needs are our priority.  As part of our continuing mission to provide you with exceptional heart care, our providers are all part of one team.  This team includes your primary Cardiologist (physician) and Advanced Practice Providers or APPs (Physician Assistants and Nurse Practitioners) who all work together to provide you with the care you need, when you need it.  Your next appointment:   2 year(s)  Provider:   You may see Vishnu P Mallipeddi, MD or one of the following Advanced Practice Providers on your designated Care Team:   [place], PA-C  Scotesia Weott, [place] Theotis Flake, [place]     We recommend signing up for the patient portal called MyChart.  Sign up information is provided on this After Visit Summary.  MyChart is used to connect with patients for Virtual Visits (Telemedicine).  Patients are able to view lab/test results, encounter notes, upcoming appointments, etc.  Non-urgent messages can be sent to your provider as well.   To learn more about what you can do with MyChart, go to [email or web address].   Other Instructions

## 2024-03-21 NOTE — Progress Notes (Signed)
 Cardiology Office Note  Date: 03/21/2024   ID: Ayvion Kavanagh., DOB 1950-06-22, MRN [id number]  PCP:  Myra Geni ORN, FNP  Cardiologist:  None Electrophysiologist:  None   History of Present Illness: Bryan Manning. is a 73 y.o. male known to have ESRD DD, HTN, OSA intolerant to CPAP he is here for posthospitalization follow-up visit.  Patient presenting with ADHF and atypical chest pain in July 2025.  After he underwent dialysis, chest pain completely resolved.  Echocardiogram was obtained and showed normal LVEF and mild aortic valve stenosis.  He is here today for follow-up visit.  He denies having any angina or DOE.  No dizziness, lightheadedness, syncope, leg swelling.  Doing great overall.    Past Medical History:  Diagnosis Date   Actinic keratosis    Anemia    Anxiety    Arthritis    Basal cell carcinoma    Back - treated in [place]   Basal cell carcinoma (BCC) 03/18/2022   L forearm - [facility] 04/23/2022   Basal cell carcinoma (BCC) 03/18/2022   R forearm - [facility] 04/23/2022   Bipolar 1 disorder (HCC)    Chronic renal insufficiency    GFR 23   Depression    Diabetes (HCC)    Diabetes mellitus, type II (HCC)    Dysplastic nevus 01/13/2024   right superior scapula, mod atypia   Dysplastic nevus 01/13/2024   left flank / side waistline, mod atypia   GERD (gastroesophageal reflux disease)    Gout    Hypertension    Sleep apnea    cannot tolerate CPAP    Past Surgical History:  Procedure Laterality Date   BACK SURGERY  2006   COLONOSCOPY WITH PROPOFOL  N/A 09/07/2018   Procedure: COLONOSCOPY WITH PROPOFOL ;  Surgeon: Toledo, Ladell POUR, MD;  Location: [facility];  Service: Gastroenterology;  Laterality: N/A;   ESOPHAGOGASTRODUODENOSCOPY (EGD) WITH PROPOFOL  N/A 09/07/2018   Procedure: ESOPHAGOGASTRODUODENOSCOPY (EGD) WITH PROPOFOL ;  Surgeon: Toledo, Ladell POUR, MD;  Location: [facility];  Service: Gastroenterology;  Laterality: N/A;   GASTRIC BYPASS  2008    INGUINAL HERNIA REPAIR     age 38   LAPAROSCOPY N/A 11/02/2022   Procedure: DIAGNOSTIC LAPAROSCOPY;  Surgeon: Tanda Locus, MD;  Location: [facility];  Service: General;  Laterality: N/A;   REVISION OF TOTAL SHOULDER  2017   soft palate reduction     TOTAL SHOULDER REPLACEMENT Right 2017   TRANSURETHRAL RESECTION OF PROSTATE     X 2    Current Outpatient Medications  Medication Sig Dispense Refill   acetaminophen  (TYLENOL ) 500 MG tablet Take 1,000 mg by mouth every 6 (six) hours as needed for moderate pain (pain score 4-6).     allopurinol (ZYLOPRIM) 300 MG tablet Take 150 mg by mouth daily.     amLODipine  (NORVASC ) 5 MG tablet Take 5 mg by mouth daily.     ARIPiprazole  (ABILIFY ) 20 MG tablet Take 20 mg by mouth daily.     atorvastatin  (LIPITOR) 10 MG tablet Take 10 mg by mouth daily.     calcitRIOL (ROCALTROL) 0.5 MCG capsule Take 0.5 mcg by mouth daily.     cyclobenzaprine (FLEXERIL) 10 MG tablet Take 10 mg by mouth 3 (three) times daily as needed for muscle spasms.     FLUoxetine  (PROZAC ) 40 MG capsule Take 40 mg by mouth daily.     furosemide  (LASIX ) 40 MG tablet Take 1 tablet (40 mg total) by mouth 2 (two) times daily. 60  tablet 0   loratadine (CLARITIN) 10 MG tablet Take 10 mg by mouth daily.     pantoprazole  (PROTONIX ) 40 MG tablet Take 40 mg by mouth daily.     tamsulosin  (FLOMAX ) 0.4 MG CAPS capsule Take 0.8 mg by mouth daily.     No current facility-administered medications for this visit.   Allergies:  Iodinated contrast media, Oxytetracycline, Ropinirole, Silicone, Tape, Amoxicillin, Tetracycline, and Nsaids   Social History: The patient  reports that he quit smoking about 27 years ago. His smoking use included cigarettes. He started smoking about 59 years ago. He has a 32 pack-year smoking history. He has never used smokeless tobacco. He reports current alcohol use. He reports current drug use. Drug: Marijuana.   Family History: The patient's family history includes Diabetes  in his mother; Heart disease in his father; Rheum arthritis in his mother.   ROS:  Please see the history of present illness. Otherwise, complete review of systems is positive for none.  All other systems are reviewed and negative.   Physical Exam: VS:  BP 125/79 (BP Location: Left Arm, Cuff Size: Normal)   Pulse 70   Wt 207 lb (93.9 kg)   SpO2 98%   BMI 26.58 kg/m , BMI Body mass index is 26.58 kg/m.  Wt Readings from Last 3 Encounters:  03/21/24 207 lb (93.9 kg)  12/12/23 187 lb (84.8 kg)  10/23/23 187 lb 9.8 oz (85.1 kg)    General: Patient appears comfortable at rest. HEENT: Conjunctiva and lids normal, oropharynx clear with moist mucosa. Neck: Supple, no elevated JVP or carotid bruits, no thyromegaly. Lungs: Clear to auscultation, nonlabored breathing at rest. Cardiac: Regular rate and rhythm, no S3 or significant systolic murmur, no pericardial rub. Abdomen: Soft, nontender, no hepatomegaly, bowel sounds present, no guarding or rebound. Extremities: No pitting edema, distal pulses 2+. Skin: Warm and dry. Musculoskeletal: No kyphosis. Neuropsychiatric: Alert and oriented x3, affect grossly appropriate.  Recent Labwork: 10/21/2023: B Natriuretic Peptide 192.0; Magnesium 2.6 10/22/2023: ALT 15; AST 15 10/23/2023: BUN 86; Creatinine, Ser 5.82; Hemoglobin 8.8; Platelets 256; Potassium 3.8; Sodium 137  No results found for: CHOL, TRIG, HDL, CHOLHDL, VLDL, LDLCALC, LDLDIRECT   Assessment and Plan:  Mild aortic valve stenosis in 2025: Asymptomatic.  Repeat echocardiogram in 3 years.  Chest pain: Likely secondary to CHF in July 2025 admission.  No recurrences since then.  No further cardiac workup at this time.  HTN, controlled: Continue current antihypertensives, amlodipine  5 mg once daily.  Lasix  dosing per nephrology.  ESRD DD: Follows up with nephrology.  30-minute spent in reviewing prior medical records, more than 3 labs, discussion and  documentation.  Medication Adjustments/Labs and Tests Ordered: Current medicines are reviewed at length with the patient today.  Concerns regarding medicines are outlined above.    Disposition:  Follow up 2 years  Signed, Vernice Mannina Arleta Maywood, MD, 03/21/2024 9:05 AM    [facility] [address] [place], [place] [address]

## 2025-01-18 ENCOUNTER — Ambulatory Visit: Admitting: Dermatology
